# Patient Record
Sex: Male | Born: 1980 | Race: White | Hispanic: No | Marital: Married | Smoking: Former smoker
Health system: Southern US, Community
[De-identification: ages and names within clinical notes are randomized; demographics above are authoritative.]

## PROBLEM LIST (undated history)

## (undated) DIAGNOSIS — N289 Disorder of kidney and ureter, unspecified: Secondary | ICD-10-CM

## (undated) DIAGNOSIS — I10 Essential (primary) hypertension: Secondary | ICD-10-CM

## (undated) DIAGNOSIS — G4730 Sleep apnea, unspecified: Secondary | ICD-10-CM

## (undated) DIAGNOSIS — E119 Type 2 diabetes mellitus without complications: Secondary | ICD-10-CM

## (undated) DIAGNOSIS — Z87442 Personal history of urinary calculi: Secondary | ICD-10-CM

## (undated) HISTORY — PX: COLONOSCOPY: SHX174

## (undated) HISTORY — PX: LITHOTRIPSY: SUR834

---

## 2007-07-02 ENCOUNTER — Emergency Department: Payer: Self-pay | Admitting: Emergency Medicine

## 2009-02-14 ENCOUNTER — Emergency Department: Payer: Self-pay | Admitting: Emergency Medicine

## 2011-08-15 ENCOUNTER — Emergency Department: Payer: Self-pay | Admitting: Emergency Medicine

## 2013-03-19 ENCOUNTER — Emergency Department: Payer: Self-pay | Admitting: Emergency Medicine

## 2013-06-08 DIAGNOSIS — M2140 Flat foot [pes planus] (acquired), unspecified foot: Secondary | ICD-10-CM | POA: Insufficient documentation

## 2013-06-08 DIAGNOSIS — M722 Plantar fascial fibromatosis: Secondary | ICD-10-CM | POA: Insufficient documentation

## 2013-12-17 ENCOUNTER — Ambulatory Visit: Payer: Self-pay | Admitting: Urology

## 2013-12-25 DIAGNOSIS — G4730 Sleep apnea, unspecified: Secondary | ICD-10-CM | POA: Insufficient documentation

## 2013-12-25 DIAGNOSIS — N200 Calculus of kidney: Secondary | ICD-10-CM | POA: Insufficient documentation

## 2013-12-25 DIAGNOSIS — I10 Essential (primary) hypertension: Secondary | ICD-10-CM | POA: Insufficient documentation

## 2016-02-15 ENCOUNTER — Ambulatory Visit
Admission: EM | Admit: 2016-02-15 | Discharge: 2016-02-15 | Disposition: A | Discharge status: Home / Self Care | Payer: 59 | Attending: Emergency Medicine | Admitting: Emergency Medicine

## 2016-02-15 ENCOUNTER — Encounter: Payer: Self-pay | Admitting: *Deleted

## 2016-02-15 DIAGNOSIS — R109 Unspecified abdominal pain: Secondary | ICD-10-CM

## 2016-02-15 DIAGNOSIS — R3129 Other microscopic hematuria: Secondary | ICD-10-CM

## 2016-02-15 HISTORY — DX: Disorder of kidney and ureter, unspecified: N28.9

## 2016-02-15 HISTORY — DX: Essential (primary) hypertension: I10

## 2016-02-15 HISTORY — DX: Sleep apnea, unspecified: G47.30

## 2016-02-15 LAB — URINALYSIS, COMPLETE (UACMP) WITH MICROSCOPIC
BACTERIA UA: NONE SEEN
BILIRUBIN URINE: NEGATIVE
Glucose, UA: NEGATIVE mg/dL
KETONES UR: NEGATIVE mg/dL
Leukocytes, UA: NEGATIVE
Nitrite: NEGATIVE
Protein, ur: NEGATIVE mg/dL
SPECIFIC GRAVITY, URINE: 1.025 (ref 1.005–1.030)
SQUAMOUS EPITHELIAL / LPF: NONE SEEN
pH: 5.5 (ref 5.0–8.0)

## 2016-02-15 MED ORDER — HYDROCODONE-ACETAMINOPHEN 5-325 MG PO TABS: 1.0000 | ORAL_TABLET | Freq: Four times a day (QID) | ORAL | 0 refills | Status: DC | PRN

## 2016-02-15 MED ORDER — CYCLOBENZAPRINE HCL 10 MG PO TABS: 10.0000 mg | ORAL_TABLET | Freq: Three times a day (TID) | ORAL | 0 refills | Status: DC | PRN

## 2016-02-15 NOTE — ED Triage Notes (Signed)
Patient started having unexplained bilateral flank pain 2 weeks ago. Pain comes and goes quickly but is becoming more intense. Patient does have a history of kidney stones.

## 2016-02-15 NOTE — ED Provider Notes (Signed)
CSN: [id number]     Arrival date & time 02/15/16  1220 History   First MD Initiated Contact with Patient 02/15/16 1350     Chief Complaint  Patient presents with  . Flank Pain   (Consider location/radiation/quality/duration/timing/severity/associated sxs/prior Treatment) 35 year old male presents with left sided flank pain that has been occurring for the past 2 weeks. He experienced sharp left sided upper thoracic/flank pain that is more like a "cramp" but has been occurring with increased frequency and intensity over the past 2 weeks. Has been trying to turn and "stretch" muscles in back with minimal relief. Has not taken any medication for symptoms. Is allergic to Aspirin and Ibuprofen (rash). Denies any dysuria, hematuria, abdominal pain, penile pain/discharge or any scrotal pain. Has history of renal stones in the past that passed without intervention. Also has history of muscular low back strain 3 years ago but this pain is more intense and more similar to episode of renal stones.    The history is provided by the patient.    Past Medical History:  Diagnosis Date  . Hypertension   . Renal disorder   . Sleep apnea    History reviewed. No pertinent surgical history. History reviewed. No pertinent family history. Social History  Substance Use Topics  . Smoking status: Former Games developermoker  . Smokeless tobacco: Never Used  . Alcohol use Yes    Review of Systems  Constitutional: Negative for appetite change, chills, fatigue and fever.  Respiratory: Negative for cough, chest tightness, shortness of breath and wheezing.   Cardiovascular: Negative for chest pain.  Gastrointestinal: Negative for abdominal pain, blood in stool, constipation, diarrhea, nausea and vomiting.  Genitourinary: Positive for flank pain. Negative for decreased urine volume, difficulty urinating, discharge, dysuria, hematuria, penile pain, penile swelling, scrotal swelling, testicular pain and urgency.  Musculoskeletal:  Positive for back pain. Negative for neck pain and neck stiffness.  Skin: Negative for rash.  Neurological: Negative for dizziness, weakness, numbness and headaches.  Hematological: Negative for adenopathy.    Allergies  Aspirin and Ibuprofen  Home Medications   Prior to Admission medications   Medication Sig Start Date End Date Taking? Authorizing Provider  triamterene-hydrochlorothiazide (DYAZIDE) 37.5-25 MG capsule Take 1 capsule by mouth daily.   Yes Historical Provider, MD  cyclobenzaprine (FLEXERIL) 10 MG tablet Take 1 tablet (10 mg total) by mouth 3 (three) times daily as needed for muscle spasms. 02/15/16   Sudie GrumblingAnn Berry Amyot, NP  HYDROcodone-acetaminophen (NORCO/VICODIN) 5-325 MG tablet Take 1-2 tablets by mouth every 6 (six) hours as needed. 02/15/16   Sudie GrumblingAnn Berry Amyot, NP   Meds Ordered and Administered this Visit  Medications - No data to display  BP (!) 159/89 (BP Location: Left Arm)   Pulse 91   Temp 97.8 F (36.6 C) (Oral)   Resp 16   Ht 5\' 11"  (1.803 m)   Wt (!) 350 lb (158.8 kg)   SpO2 100%   BMI 48.82 kg/m  No data found.   Physical Exam  Constitutional: He is oriented to person, place, and time. He appears well-developed and well-nourished. No distress.  Cardiovascular: Normal rate, regular rhythm and normal heart sounds.   Pulmonary/Chest: Effort normal and breath sounds normal. No respiratory distress. He has no decreased breath sounds. He has no wheezes.  Abdominal: Soft. Bowel sounds are normal. He exhibits no distension and no mass. There is no hepatosplenomegaly. There is no tenderness. There is CVA tenderness (Left). There is no rigidity, no rebound and no  guarding.  Musculoskeletal: He exhibits tenderness.       Lumbar back: He exhibits tenderness, pain and spasm. He exhibits no swelling, no edema and no deformity.       Back:  Tender along left lower thoracic region/flank area with some tenderness along left upper abdomen. Has full range of motion of  back. Pain is constant and does not change with movement.    Neurological: He is alert and oriented to person, place, and time. He has normal strength and normal reflexes. No sensory deficit.  Skin: Skin is warm and dry. Capillary refill takes less than 2 seconds.  Psychiatric: He has a normal mood and affect. His behavior is normal. Judgment and thought content normal.    Urgent Care Course   Clinical Course     Procedures (including critical care time)  Labs Review Labs Reviewed  URINALYSIS, COMPLETE (UACMP) WITH MICROSCOPIC - Abnormal; Notable for the following:       Result Value   APPearance HAZY (*)    Hgb urine dipstick MODERATE (*)    All other components within normal limits    Imaging Review No results found.   Visual Acuity Review  Right Eye Distance:   Left Eye Distance:   Bilateral Distance:    Right Eye Near:   Left Eye Near:    Bilateral Near:         MDM   1. Left flank pain   2. Other microscopic hematuria    Discussed with patient that he may have a renal stone. His exam findings also suggest a musculoskeletal component as well. Reviewed urinalysis results- positive blood- with patient. May try Flexeril 10mg  3 times a day as needed for muscle spasms. Recommend Vicodin 5/325mg  #12 no refill 1-2 tablets every 6 hours as needed for pain. [place] Controlled Substance Registry reviewed- no active RX for patient in past 90 days. Patient has made an appointment with his PCP tomorrow for recheck. Recommend strain urine today.  Follow-up with his PCP tomorrow as planned or go to ER if symptoms worsen.     Sudie GrumblingAnn Berry Amyot, NP 02/15/16 [phone]

## 2016-02-15 NOTE — Discharge Instructions (Signed)
Your urinalysis showed moderate amount of blood in your urine but no signs of infection. Recommend use Flexeril 10mg  every 8 hours as needed for muscle spasms. May take Vicodin 1-2 tablets every 6 hours as needed for severe pain. Follow-up with your primary care provider tomorrow for recheck. May need referral to Urologist and/or additional testing.

## 2016-12-10 ENCOUNTER — Telehealth: Payer: Self-pay | Admitting: *Deleted

## 2016-12-10 ENCOUNTER — Encounter: Payer: Self-pay | Admitting: *Deleted

## 2016-12-10 ENCOUNTER — Ambulatory Visit
Admission: EM | Admit: 2016-12-10 | Discharge: 2016-12-10 | Disposition: A | Discharge status: Home / Self Care | Payer: No Typology Code available for payment source | Attending: Family Medicine | Admitting: Family Medicine

## 2016-12-10 DIAGNOSIS — M109 Gout, unspecified: Secondary | ICD-10-CM

## 2016-12-10 DIAGNOSIS — M79674 Pain in right toe(s): Secondary | ICD-10-CM | POA: Diagnosis not present

## 2016-12-10 MED ORDER — PREDNISONE 20 MG PO TABS: ORAL_TABLET | ORAL | 0 refills | Status: DC

## 2016-12-10 MED ORDER — HYDROCODONE-ACETAMINOPHEN 5-325 MG PO TABS
ORAL_TABLET | ORAL | 0 refills | Status: DC
Start: 2016-12-10 — End: 2019-11-30

## 2016-12-10 NOTE — ED Provider Notes (Signed)
[facility]    CSN: [id number] Arrival date & time: 12/10/16  1253     History   Chief Complaint Chief Complaint  Patient presents with  . Foot Pain    HPI Frank Decker is a 36 y.o. male.   36 yo male with a c/o right big toe pain for 2 days. States pain started suddenly without any history of trauma/injury. Denies any fevers, chills, rash.    The history is provided by the patient.  Foot Pain     Past Medical History:  Diagnosis Date  . Hypertension   . Renal disorder   . Sleep apnea     There are no active problems to display for this patient.   History reviewed. No pertinent surgical history.     Home Medications    Prior to Admission medications   Medication Sig Start Date End Date Taking? Authorizing Provider  triamterene-hydrochlorothiazide (DYAZIDE) 37.5-25 MG capsule Take 1 capsule by mouth daily.   Yes [provider]  cyclobenzaprine (FLEXERIL) 10 MG tablet Take 1 tablet (10 mg total) by mouth 3 (three) times daily as needed for muscle spasms. 02/15/16   Sudie Grumbling, NP  HYDROcodone-acetaminophen (NORCO/VICODIN) 5-325 MG tablet 1-2 tabs po bid prn 12/10/16   Payton Mccallum, MD  predniSONE (DELTASONE) 20 MG tablet 3 tabs po qd for 2 days, then 2 tabs po qd for 3 days, then 1 tab po qd for 3 days, then half a tab po qd for 2 days 12/10/16   Payton Mccallum, MD    Family History History reviewed. No pertinent family history.  Social History Social History  Substance Use Topics  . Smoking status: Former Games developer  . Smokeless tobacco: Never Used  . Alcohol use Yes     Allergies   Aspirin and Ibuprofen   Review of Systems Review of Systems   Physical Exam Triage Vital Signs ED Triage Vitals [12/10/16 1327]  Enc Vitals Group     BP (!) 150/97     Pulse Rate 97     Resp 16     Temp 97.8 F (36.6 C)     Temp Source Oral     SpO2 99 %     Weight (!) 350 lb (158.8 kg)     Height  (1.803 m)     Head  Circumference      Peak Flow      Pain Score 8     Pain Loc      Pain Edu?      Excl. in GC?    No data found.   Updated Vital Signs BP (!) 150/97 (BP Location: Left Arm)   Pulse 97   Temp 97.8 F (36.6 C) (Oral)   Resp 16   Ht  (1.803 m)   Wt (!) 350 lb (158.8 kg)   SpO2 99%   BMI 48.82 kg/m   Visual Acuity Right Eye Distance:   Left Eye Distance:   Bilateral Distance:    Right Eye Near:   Left Eye Near:    Bilateral Near:     Physical Exam  Constitutional: He appears well-developed and well-nourished. No distress.  Musculoskeletal:       Right foot: There is bony tenderness (over the 1st MTP joint) and swelling. There is normal range of motion, normal capillary refill, no crepitus, no deformity and no laceration.  Skin: He is not diaphoretic.  Nursing note and vitals reviewed.    UC Treatments /  Results  Labs (all labs ordered are listed, but only abnormal results are displayed) Labs Reviewed - No data to display  EKG  EKG Interpretation None       Radiology No results found.  Procedures Procedures (including critical care time)  Medications Ordered in UC Medications - No data to display   Initial Impression / Assessment and Plan / UC Course  I have reviewed the triage vital signs and the nursing notes.  Pertinent labs & imaging results that were available during my care of the patient were reviewed by me and considered in my medical decision making (see chart for details).       Final Clinical Impressions(s) / UC Diagnoses   Final diagnoses:  Gout of big toe    New Prescriptions Discharge Medication List as of 12/10/2016  2:32 PM    START taking these medications   Details  predniSONE (DELTASONE) 20 MG tablet 3 tabs po qd for 2 days, then 2 tabs po qd for 3 days, then 1 tab po qd for 3 days, then half a tab po qd for 2 days, Normal       1. diagnosis reviewed with patient 2. rx as per orders above; reviewed possible side  effects, interactions, risks and benefits  3. Recommend supportive treatment with rest, elevation  4. Follow-up prn if symptoms worsen or don't improve  Controlled Substance Prescriptions [place] Controlled Substance Registry consulted? No   Payton Mccallum, MD 12/10/16 [phone]

## 2016-12-10 NOTE — ED Triage Notes (Signed)
PAtient started having right foot discomfort 2 days ago that has become right foot pain this AM. Patient reports participating in a 5K walk 2 days ago.

## 2017-04-17 DIAGNOSIS — R7303 Prediabetes: Secondary | ICD-10-CM | POA: Insufficient documentation

## 2017-05-12 ENCOUNTER — Encounter: Payer: Self-pay | Admitting: Emergency Medicine

## 2017-05-12 ENCOUNTER — Emergency Department
Admission: EM | Admit: 2017-05-12 | Discharge: 2017-05-12 | Disposition: A | Discharge status: Home / Self Care | Payer: No Typology Code available for payment source | Attending: Emergency Medicine | Admitting: Emergency Medicine

## 2017-05-12 ENCOUNTER — Emergency Department: Payer: No Typology Code available for payment source

## 2017-05-12 DIAGNOSIS — Z87891 Personal history of nicotine dependence: Secondary | ICD-10-CM | POA: Insufficient documentation

## 2017-05-12 DIAGNOSIS — Z79899 Other long term (current) drug therapy: Secondary | ICD-10-CM | POA: Insufficient documentation

## 2017-05-12 DIAGNOSIS — R079 Chest pain, unspecified: Secondary | ICD-10-CM

## 2017-05-12 DIAGNOSIS — R0789 Other chest pain: Secondary | ICD-10-CM | POA: Insufficient documentation

## 2017-05-12 DIAGNOSIS — I10 Essential (primary) hypertension: Secondary | ICD-10-CM | POA: Insufficient documentation

## 2017-05-12 LAB — BASIC METABOLIC PANEL
Anion gap: 11 (ref 5–15)
BUN: 13 mg/dL (ref 6–20)
CHLORIDE: 108 mmol/L (ref 101–111)
CO2: 22 mmol/L (ref 22–32)
Calcium: 9.3 mg/dL (ref 8.9–10.3)
Creatinine, Ser: 0.96 mg/dL (ref 0.61–1.24)
GFR calc Af Amer: >60 mL/min (ref >60)
GFR calc non Af Amer: >60 mL/min (ref >60)
GLUCOSE: 102 mg/dL — AB (ref 65–99)
POTASSIUM: 3.6 mmol/L (ref 3.5–5.1)
Sodium: 141 mmol/L (ref 135–145)

## 2017-05-12 LAB — CBC
HEMATOCRIT: 42.7 % (ref 40.0–52.0)
HEMOGLOBIN: 14.6 g/dL (ref 13.0–18.0)
MCH: 30.6 pg (ref 26.0–34.0)
MCHC: 34.1 g/dL (ref 32.0–36.0)
MCV: 89.8 fL (ref 80.0–100.0)
Platelets: 319 10*3/uL (ref 150–440)
RBC: 4.76 MIL/uL (ref 4.40–5.90)
RDW: 13.1 % (ref 11.5–14.5)
WBC: 10.4 10*3/uL (ref 3.8–10.6)

## 2017-05-12 LAB — TROPONIN I: Troponin I: <0.03 ng/mL (ref <0.03)

## 2017-05-12 MED ORDER — GI COCKTAIL ~~LOC~~
ORAL | Status: AC
  Filled 2017-05-12: qty 30

## 2017-05-12 MED ORDER — GI COCKTAIL ~~LOC~~
30.0000 mL | Freq: Once | ORAL | Status: AC
  Administered 2017-05-12: 30 mL via ORAL

## 2017-05-12 NOTE — ED Provider Notes (Signed)
[facility] Emergency Department Provider Note _________________________________   I have reviewed the triage vital signs and the nursing notes.   HISTORY  Chief Complaint Chest Pain   History limited by: Not Limited   HPI Frank Decker is a 37 y.o. male who presents to the emergency department today because of concerns for chest pain.  Is located in the center chest.  He describes as pressure.  It has been off and on for a couple of weeks.  Did go to an outside emergency department for where he states he was given some Ativan which helped.  Unclear exactly what labs and tests were performed at that time although it was recommended he follow-up and get a stress test.  The patient has not noticed any pattern to the pain.  He states it will happen during exertion and at rest.  Is not positional.  It does not change depending on whether he has recently eaten or not.   Per medical record review patient has a history of HTN  Past Medical History:  Diagnosis Date  . Hypertension   . Renal disorder   . Sleep apnea     There are no active problems to display for this patient.   History reviewed. No pertinent surgical history.  Prior to Admission medications   Medication Sig Start Date End Date Taking? Authorizing Provider  cyclobenzaprine (FLEXERIL) 10 MG tablet Take 1 tablet (10 mg total) by mouth 3 (three) times daily as needed for muscle spasms. 02/15/16   Sudie Grumbling, NP  HYDROcodone-acetaminophen (NORCO/VICODIN) 5-325 MG tablet 1-2 tabs po bid prn 12/10/16   Payton Mccallum, MD  predniSONE (DELTASONE) 20 MG tablet 3 tabs po qd for 2 days, then 2 tabs po qd for 3 days, then 1 tab po qd for 3 days, then half a tab po qd for 2 days 12/10/16   Payton Mccallum, MD  triamterene-hydrochlorothiazide (DYAZIDE) 37.5-25 MG capsule Take 1 capsule by mouth daily.    [provider]    Allergies Aspirin and Ibuprofen  No family history on file.  Social  History Social History   Tobacco Use  . Smoking status: Former Games developer  . Smokeless tobacco: Never Used  Substance Use Topics  . Alcohol use: Yes  . Drug use: No    Review of Systems Constitutional: No fever/chills Eyes: No visual changes. ENT: No sore throat. Cardiovascular: Denies chest pain. Respiratory: Positive for shortness of breath. Gastrointestinal: No abdominal pain.  No nausea, no vomiting.  No diarrhea.   Genitourinary: Negative for dysuria. Musculoskeletal: Negative for back pain. Skin: Negative for rash. Neurological: Negative for headaches, focal weakness or numbness.  ____________________________________________   PHYSICAL EXAM:  VITAL SIGNS: ED Triage Vitals  Enc Vitals Group     BP 05/12/17 1553 (!) 164/88     Pulse Rate 05/12/17 1553 85     Resp --      Temp 05/12/17 1553 98.2 F (36.8 C)     Temp Source 05/12/17 1553 Oral     SpO2 05/12/17 1553 99 %     Weight 05/12/17 1551 (!) 338 lb (153.3 kg)     Height 05/12/17 1551 5\' 11"  (1.803 m)     Head Circumference --      Peak Flow --      Pain Score 05/12/17 1551 3   Constitutional: Alert and oriented. Well appearing and in no distress. Eyes: Conjunctivae are normal.  ENT   Head: Normocephalic and atraumatic.  Nose: No congestion/rhinnorhea.   Mouth/Throat: Mucous membranes are moist.   Neck: No stridor. Hematological/Lymphatic/Immunilogical: No cervical lymphadenopathy. Cardiovascular: Normal rate, regular rhythm.  No murmurs, rubs, or gallops.  Respiratory: Normal respiratory effort without tachypnea nor retractions. Breath sounds are clear and equal bilaterally. No wheezes/rales/rhonchi. Gastrointestinal: Soft and non tender. No rebound. No guarding.  Genitourinary: Deferred Musculoskeletal: Normal range of motion in all extremities. No lower extremity edema. Neurologic:  Normal speech and language. No gross focal neurologic deficits are appreciated.  Skin:  Skin is warm, dry  and intact. No rash noted. Psychiatric: Mood and affect are normal. Speech and behavior are normal. Patient exhibits appropriate insight and judgment.  ____________________________________________    LABS (pertinent positives/negatives)  Trop <0.03 CBC wnl BMP glu 102 otherwise wnl ____________________________________________   EKG  I, Phineas SemenGraydon Lovelle Lema, attending physician, personally viewed and interpreted this EKG  EKG Time: 1552 Rate: 86 Rhythm: normal sinus rhythm Axis: normal Intervals: qtc 433 QRS: narrow ST changes: no st elevation Impression: normal ekg   ____________________________________________    RADIOLOGY  CXR No acute disease  ____________________________________________   PROCEDURES  Procedures  ____________________________________________   INITIAL IMPRESSION / ASSESSMENT AND PLAN / ED COURSE  Pertinent labs & imaging results that were available during my care of the patient were reviewed by me and considered in my medical decision making (see chart for details).  Patient presented to the emergency department today because of concerns for chest pain.  The patient had apparently been seen at an outside ED for this problem roughly 2 weeks ago and felt better after ativan. Work up here without any concerning findings for ACS, pneumonia, PTX. Doubt PE at this time. Doubt dissection. Did try GI cocktail which did not give any significant relief. At this point think anxiety possible. Discussed this with patient, will give [facility] follow up information. Also discussed return precautions.    ____________________________________________   FINAL CLINICAL IMPRESSION(S) / ED DIAGNOSES  Final diagnoses:  Nonspecific chest pain     Note: This dictation was prepared with Dragon dictation. Any transcriptional errors that result from this process are unintentional     Phineas SemenGoodman, Necole Minassian, MD 05/12/17 2016

## 2017-05-12 NOTE — Discharge Instructions (Signed)
Please seek medical attention for any high fevers, chest pain, shortness of breath, change in behavior, persistent vomiting, bloody stool or any other new or concerning symptoms.  

## 2017-05-12 NOTE — ED Notes (Signed)
Pt reports that he is having some chest pressure that it is intermittent for a week.  Pt states that sometimes it happens after he eats, but sometimes unrelated.  Pt denies SOB and dizziness.  Pt reports that when he takes a deep breath he can reproduce the pain.

## 2017-05-12 NOTE — ED Triage Notes (Signed)
Pt comes into the ED iv aPOV c/o central chest tightness that started today.  Denies any radiating pain, shortness of breath, or nausea.  Patient in NAD at this time with even and unlabored respiration.  Denies any cardiac history or recent coughs.

## 2019-06-03 ENCOUNTER — Emergency Department: Payer: BC Managed Care – PPO

## 2019-06-03 ENCOUNTER — Encounter: Payer: Self-pay | Admitting: Emergency Medicine

## 2019-06-03 ENCOUNTER — Other Ambulatory Visit: Payer: Self-pay

## 2019-06-03 ENCOUNTER — Emergency Department
Admission: EM | Admit: 2019-06-03 | Discharge: 2019-06-03 | Disposition: A | Discharge status: Home / Self Care | Payer: BC Managed Care – PPO | Attending: Emergency Medicine | Admitting: Emergency Medicine

## 2019-06-03 DIAGNOSIS — M79662 Pain in left lower leg: Secondary | ICD-10-CM | POA: Diagnosis present

## 2019-06-03 DIAGNOSIS — I82402 Acute embolism and thrombosis of unspecified deep veins of left lower extremity: Secondary | ICD-10-CM | POA: Insufficient documentation

## 2019-06-03 DIAGNOSIS — Z87891 Personal history of nicotine dependence: Secondary | ICD-10-CM | POA: Diagnosis not present

## 2019-06-03 DIAGNOSIS — I10 Essential (primary) hypertension: Secondary | ICD-10-CM | POA: Insufficient documentation

## 2019-06-03 NOTE — ED Provider Notes (Signed)
[facility] Emergency Department Provider Note       Time seen: ----------------------------------------- 12:36 PM on 06/03/2019 -----------------------------------------   I have reviewed the triage vital signs and the nursing notes.  HISTORY   Chief Complaint Leg Pain   HPI Frank Decker is a 39 y.o. male with a history of hypertension, renal disorder, sleep apnea who presents to the ED for left calf pain that was sent by his doctor because he had elevated D-dimer.  Patient was walking with a slight limp, arrives in no acute distress.  Past Medical History:  Diagnosis Date  . Hypertension   . Renal disorder   . Sleep apnea     There are no problems to display for this patient.   History reviewed. No pertinent surgical history.  Allergies Aspirin and Ibuprofen  Social History Social History   Tobacco Use  . Smoking status: Former Games developer  . Smokeless tobacco: Never Used  Substance Use Topics  . Alcohol use: Yes  . Drug use: No    Review of Systems Constitutional: Negative for fever. Cardiovascular: Negative for chest pain. Respiratory: Negative for shortness of breath. Gastrointestinal: Negative for abdominal pain, vomiting and diarrhea. Musculoskeletal: Positive for left leg pain Skin: Negative for rash. Neurological: Negative for headaches, focal weakness or numbness.  All systems negative/normal/unremarkable except as stated in the HPI  ____________________________________________   PHYSICAL EXAM:  VITAL SIGNS: ED Triage Vitals [06/03/19 1106]  Enc Vitals Group     BP 131/74     Pulse Rate 91     Resp 20     Temp 98.5 F (36.9 C)     Temp Source Oral     SpO2 98 %     Weight (!) 350 lb (158.8 kg)     Height 5\' 11"  (1.803 m)     Head Circumference      Peak Flow      Pain Score      Pain Loc      Pain Edu?      Excl. in GC?     Constitutional: Alert and oriented. Well appearing and in no distress. Eyes:  Conjunctivae are normal. Normal extraocular movements. Cardiovascular: Normal rate, regular rhythm. No murmurs, rubs, or gallops. Respiratory: Normal respiratory effort without tachypnea nor retractions. Breath sounds are clear and equal bilaterally. No wheezes/rales/rhonchi. Gastrointestinal: Soft and nontender. Normal bowel sounds Musculoskeletal: Nontender with normal range of motion in extremities. No lower extremity tenderness nor edema. Neurologic:  Normal speech and language. No gross focal neurologic deficits are appreciated.  Skin:  Skin is warm, dry and intact. No rash noted. Psychiatric: Mood and affect are normal. Speech and behavior are normal.  ____________________________________________  ED COURSE:  As part of my medical decision making, I reviewed the following data within the electronic MEDICAL RECORD NUMBER History obtained from family if available, nursing notes, old chart and ekg, as well as notes from prior ED visits. Patient presented for left leg pain with outpatient elevated D-dimer, we will assess with labs and imaging as indicated at this time.   Procedures  Haitham Dolinsky was evaluated in Emergency Department on 06/03/2019 for the symptoms described in the history of present illness. He was evaluated in the context of the global COVID-19 pandemic, which necessitated consideration that the patient might be at risk for infection with the SARS-CoV-2 virus that causes COVID-19. Institutional protocols and algorithms that pertain to the evaluation of patients at risk for COVID-19 are in a state of  rapid change based on information released by regulatory bodies including the CDC and federal and state organizations. These policies and algorithms were followed during the patient's care in the ED.  ____________________________________________   RADIOLOGY Images were viewed by me  Lower extremity ultrasound IMPRESSION:  Positive for deep venous thrombosis in the left lower  extremity.  Thrombus involving a left peroneal vein. No evidence for deep venous  thrombosis above the left knee.   Small fluid collection along the anterolateral left knee at the area  of pain.  ____________________________________________   DIFFERENTIAL DIAGNOSIS   DVT, muscle strain, Baker's cyst, cellulitis  FINAL ASSESSMENT AND PLAN  Left leg pain, DVT   Plan: The patient had presented for left leg pain. Patient's imaging did reveal an isolated DVT below the knee in the left peroneal vein.  Upon discussion with vascular surgery this can be treated with follow-up ultrasound in 7 to 14 days.  He is allergic aspirin, he is encouraged to return to light duty and return for worsening or worrisome symptoms.  He has no chest pain or difficulty breathing.   Laurence Aly, MD    Note: This note was generated in part or whole with voice recognition software. Voice recognition is usually quite accurate but there are transcription errors that can and very often do occur. I apologize for any typographical errors that were not detected and corrected.     Earleen Newport, MD 06/03/19 1341

## 2019-06-03 NOTE — ED Notes (Addendum)
Patient reports sent to ed for left lower leg u/s r/o DVT. Patient denies sob primary complaint of pain to left calf radiates into left knee.  Awaiting u/s. Will continue to monitor.

## 2019-06-03 NOTE — ED Triage Notes (Signed)
Patient presents to the ED with left calf pain and was sent to the ED by his PCP because he had an elevated d-dimer.  Patient walking with a slight limp.  Patient is in no obvious distress at this time.

## 2019-06-03 NOTE — ED Notes (Signed)
Patient off unit to u/s  

## 2019-06-09 ENCOUNTER — Other Ambulatory Visit: Payer: Self-pay | Admitting: Medical Oncology

## 2019-06-09 ENCOUNTER — Emergency Department
Admission: EM | Admit: 2019-06-09 | Discharge: 2019-06-09 | Disposition: A | Discharge status: Home / Self Care | Payer: BC Managed Care – PPO | Attending: Emergency Medicine | Admitting: Emergency Medicine

## 2019-06-09 ENCOUNTER — Other Ambulatory Visit: Payer: Self-pay

## 2019-06-09 ENCOUNTER — Ambulatory Visit
Admission: RE | Admit: 2019-06-09 | Discharge: 2019-06-09 | Disposition: A | Discharge status: Home / Self Care | Payer: BC Managed Care – PPO | Source: Ambulatory Visit | Attending: Medical Oncology | Admitting: Medical Oncology

## 2019-06-09 ENCOUNTER — Encounter: Payer: Self-pay | Admitting: *Deleted

## 2019-06-09 DIAGNOSIS — I824Y2 Acute embolism and thrombosis of unspecified deep veins of left proximal lower extremity: Secondary | ICD-10-CM | POA: Diagnosis not present

## 2019-06-09 DIAGNOSIS — Z87891 Personal history of nicotine dependence: Secondary | ICD-10-CM | POA: Insufficient documentation

## 2019-06-09 DIAGNOSIS — I10 Essential (primary) hypertension: Secondary | ICD-10-CM | POA: Diagnosis not present

## 2019-06-09 DIAGNOSIS — Z79899 Other long term (current) drug therapy: Secondary | ICD-10-CM | POA: Insufficient documentation

## 2019-06-09 DIAGNOSIS — M79605 Pain in left leg: Secondary | ICD-10-CM | POA: Insufficient documentation

## 2019-06-09 MED ORDER — APIXABAN 5 MG PO TABS
10.0000 mg | ORAL_TABLET | Freq: Once | ORAL | Status: AC
  Administered 2019-06-09: 20:00:00 10 mg via ORAL
  Filled 2019-06-09: qty 2

## 2019-06-09 MED ORDER — APIXABAN (ELIQUIS) VTE STARTER PACK (10MG AND 5MG): ORAL_TABLET | ORAL | 0 refills | Status: DC

## 2019-06-09 NOTE — Discharge Instructions (Signed)
Please seek medical attention for any high fevers, chest pain, shortness of breath, change in behavior, persistent vomiting, bloody stool or any other new or concerning symptoms.  

## 2019-06-09 NOTE — ED Triage Notes (Signed)
Pt recently dx with DVT last week and was sent home without prescription blood thinners. Pt had a follow up US that pt reports showed a worsening of DVT. PT to ED due to increased pain in left calf. Pt limping to triage.

## 2019-06-09 NOTE — ED Provider Notes (Signed)
[facility] Emergency Department Provider Note   ____________________________________________   I have reviewed the triage vital signs and the nursing notes.   HISTORY  Chief Complaint DVT   History limited by: Not Limited   HPI Frank Decker is a 39 y.o. male who presents to the emergency department today because of continued left leg pain and an ultrasound which shows deep vein thrombosis.  Patient was seen in the emergency department last week and diagnosed with DVT.  At that time the plan was for repeat ultrasound.  Repeat ultrasound today continues to show the deep vein thrombosis.  Given continued and slightly worsening pain patient came to the emergency department. Pain is worse with ambulation. Patient denies any chest pain or shortness of breath.    Records reviewed. Per medical record review patient has a history of htn. Recent ed visit for DVT.   Past Medical History:  Diagnosis Date  . Hypertension   . Renal disorder   . Sleep apnea     There are no problems to display for this patient.   History reviewed. No pertinent surgical history.  Prior to Admission medications   Medication Sig Start Date End Date Taking? Authorizing Provider  cyclobenzaprine (FLEXERIL) 10 MG tablet Take 1 tablet (10 mg total) by mouth 3 (three) times daily as needed for muscle spasms. 02/15/16   Katy Apo, NP  HYDROcodone-acetaminophen (NORCO/VICODIN) 5-325 MG tablet 1-2 tabs po bid prn 12/10/16   Norval Gable, MD  predniSONE (DELTASONE) 20 MG tablet 3 tabs po qd for 2 days, then 2 tabs po qd for 3 days, then 1 tab po qd for 3 days, then half a tab po qd for 2 days 12/10/16   Norval Gable, MD  triamterene-hydrochlorothiazide (DYAZIDE) 37.5-25 MG capsule Take 1 capsule by mouth daily.    [provider]    Allergies Aspirin and Ibuprofen  History reviewed. No pertinent family history.  Social History Social History   Tobacco Use  . Smoking  status: Former Research scientist (life sciences)  . Smokeless tobacco: Never Used  Substance Use Topics  . Alcohol use: Yes  . Drug use: No    Review of Systems Constitutional: No fever/chills Eyes: No visual changes. ENT: No sore throat. Cardiovascular: Denies chest pain. Respiratory: Denies shortness of breath. Gastrointestinal: No abdominal pain.  No nausea, no vomiting.  No diarrhea.   Genitourinary: Negative for dysuria. Musculoskeletal: Positive for left leg pain. Skin: Negative for rash. Neurological: Negative for headaches, focal weakness or numbness.  ____________________________________________   PHYSICAL EXAM:  VITAL SIGNS: ED Triage Vitals  Enc Vitals Group     BP 06/09/19 1933 (!) 151/92     Pulse Rate 06/09/19 1933 91     Resp 06/09/19 1930 16     Temp 06/09/19 1933 98.4 F (36.9 C)     Temp Source 06/09/19 1930 Oral     SpO2 --      Weight 06/09/19 1925 (!) 350 lb (158.8 kg)     Height 06/09/19 1925 5\' 11"  (1.803 m)     Head Circumference --      Peak Flow --      Pain Score 06/09/19 1923 3   Constitutional: Alert and oriented.  Eyes: Conjunctivae are normal.  ENT      Head: Normocephalic and atraumatic.      Nose: No congestion/rhinnorhea.      Mouth/Throat: Mucous membranes are moist.      Neck: No stridor. Hematological/Lymphatic/Immunilogical: No cervical lymphadenopathy. Cardiovascular:  Normal rate, regular rhythm.  No murmurs, rubs, or gallops.  Respiratory: Normal respiratory effort without tachypnea nor retractions. Breath sounds are clear and equal bilaterally. No wheezes/rales/rhonchi. Gastrointestinal: Soft and non tender. No rebound. No guarding.  Genitourinary: Deferred Musculoskeletal: Normal range of motion in all extremities. Slight swelling noted to the left leg.  Neurologic:  Normal speech and language. No gross focal neurologic deficits are appreciated.  Skin:  Skin is warm, dry and intact. No rash noted. Psychiatric: Mood and affect are normal. Speech  and behavior are normal. Patient exhibits appropriate insight and judgment.  ____________________________________________    LABS (pertinent positives/negatives)  None  ____________________________________________   EKG  None  ____________________________________________    RADIOLOGY  None  ____________________________________________   PROCEDURES  Procedures  ____________________________________________   INITIAL IMPRESSION / ASSESSMENT AND PLAN / ED COURSE  Pertinent labs & imaging results that were available during my care of the patient were reviewed by me and considered in my medical decision making (see chart for details).   Patient presented to the emergency department today because of concerns for worsening left leg pain and ultrasound which did not show deep vein thrombosis.  Will plan on putting patient on blood thinning medication at this time.  Did discuss pain control and at this time patient did not want any prescription pain medications. No symptoms concerning for PE.  Will give patient vascular surgery follow-up.  ____________________________________________   FINAL CLINICAL IMPRESSION(S) / ED DIAGNOSES  Final diagnoses:  Acute deep vein thrombosis (DVT) of proximal vein of left lower extremity (HCC)     Note: This dictation was prepared with Dragon dictation. Any transcriptional errors that result from this process are unintentional     Phineas Semen, MD 06/09/19 2153

## 2019-06-10 ENCOUNTER — Ambulatory Visit: Payer: No Typology Code available for payment source

## 2019-06-15 ENCOUNTER — Other Ambulatory Visit (INDEPENDENT_AMBULATORY_CARE_PROVIDER_SITE_OTHER): Payer: Self-pay | Admitting: Nurse Practitioner

## 2019-06-15 DIAGNOSIS — I82452 Acute embolism and thrombosis of left peroneal vein: Secondary | ICD-10-CM

## 2019-06-18 ENCOUNTER — Ambulatory Visit (INDEPENDENT_AMBULATORY_CARE_PROVIDER_SITE_OTHER): Payer: BC Managed Care – PPO | Admitting: Nurse Practitioner

## 2019-06-18 ENCOUNTER — Ambulatory Visit (INDEPENDENT_AMBULATORY_CARE_PROVIDER_SITE_OTHER): Payer: BC Managed Care – PPO

## 2019-06-18 ENCOUNTER — Other Ambulatory Visit: Payer: Self-pay

## 2019-06-18 ENCOUNTER — Encounter (INDEPENDENT_AMBULATORY_CARE_PROVIDER_SITE_OTHER): Payer: Self-pay | Admitting: Nurse Practitioner

## 2019-06-18 VITALS — BP 148/96 | HR 85 | Resp 16 | Wt 351.8 lb

## 2019-06-18 DIAGNOSIS — I82452 Acute embolism and thrombosis of left peroneal vein: Secondary | ICD-10-CM

## 2019-06-18 DIAGNOSIS — E669 Obesity, unspecified: Secondary | ICD-10-CM | POA: Insufficient documentation

## 2019-06-18 DIAGNOSIS — I10 Essential (primary) hypertension: Secondary | ICD-10-CM | POA: Diagnosis not present

## 2019-06-18 DIAGNOSIS — Z8709 Personal history of other diseases of the respiratory system: Secondary | ICD-10-CM | POA: Insufficient documentation

## 2019-06-22 NOTE — Progress Notes (Signed)
Subjective:    Patient ID: Frank Decker, male    DOB: [birth year], 39 y.o.   MRN: [id number] Chief Complaint  Patient presents with  . Follow-up    The patient presented to [facility] on 06/03/2019 with left calf pain and swelling.  The patient was subsequently found to have a DVT within the left peroneal vein.  The DVT appeared to be nonprovoked.  At that time no anticoagulation was started due to this being a distal DVT.  The patient has an allergy to aspirin so that was not advised.  However, the patient return to the emergency room on 06/09/2019.  There was no worsening of DVT found.  Based upon the patient's worsening swelling and pain he was placed on Eliquis.  Since that time the patient has been doing well on Eliquis without issue.  He denies any issues with bleeding.  The patient also has been utilizing medical grade 1 compression to help with the swelling and discomfort.  The patient does endorse some lower extremity pain however he endorses that he has been getting over a recent gout flare as well.  Today noninvasive study showed no evidence of DVT or superficial vein thrombosis.  In the left lower extremity.  Full compressibility within the left vein.   Review of Systems  Cardiovascular: Positive for leg swelling.  Musculoskeletal: Positive for arthralgias and gait problem.  All other systems reviewed and are negative.      Objective:   Physical Exam Vitals reviewed.  HENT:     Head: Normocephalic.  Cardiovascular:     Rate and Rhythm: Normal rate and regular rhythm.     Pulses: Normal pulses.  Musculoskeletal:     Left lower leg: Edema present.  Skin:    Capillary Refill: Capillary refill takes less than 2 seconds.  Neurological:     Mental Status: He is alert and oriented to person, place, and time.  Psychiatric:        Mood and Affect: Mood normal.        Behavior: Behavior normal.        Thought Content: Thought content normal.      Judgment: Judgment normal.     BP (!) 148/96 (BP Location: Right Arm)   Pulse 85   Resp 16   Wt (!) 351 lb 12.8 oz (159.6 kg)   BMI 49.07 kg/m   Past Medical History:  Diagnosis Date  . Hypertension   . Renal disorder   . Sleep apnea     Social History   Socioeconomic History  . Marital status: Married    Spouse name: Not on file  . Number of children: Not on file  . Years of education: Not on file  . Highest education level: Not on file  Occupational History  . Not on file  Tobacco Use  . Smoking status: Former Research scientist (life sciences)  . Smokeless tobacco: Never Used  Substance and Sexual Activity  . Alcohol use: Yes  . Drug use: No  . Sexual activity: Not on file  Other Topics Concern  . Not on file  Social History Narrative  . Not on file   Social Determinants of Health   Financial Resource Strain:   . Difficulty of Paying Living Expenses:   Food Insecurity:   . Worried About Charity fundraiser in the Last Year:   . Arboriculturist in the Last Year:   Transportation Needs:   . Film/video editor (Medical):   Marland Kitchen  Lack of Transportation (Non-Medical):   Physical Activity:   . Days of Exercise per Week:   . Minutes of Exercise per Session:   Stress:   . Feeling of Stress :   Social Connections:   . Frequency of Communication with Friends and Family:   . Frequency of Social Gatherings with Friends and Family:   . Attends Religious Services:   . Active Member of Clubs or Organizations:   . Attends Banker Meetings:   Marland Kitchen Marital Status:   Intimate Partner Violence:   . Fear of Current or Ex-Partner:   . Emotionally Abused:   Marland Kitchen Physically Abused:   . Sexually Abused:     History reviewed. No pertinent surgical history.  Family History  Problem Relation Age of Onset  . Diabetes Paternal Grandfather     Allergies  Allergen Reactions  . Aspirin Rash  . Ibuprofen Rash       Assessment & Plan:   1. Deep venous thrombosis (DVT) of left peroneal  vein, unspecified chronicity (HCC) Today it appears that the patient's DVT has resolved.  However the patient will continue with anticoagulation.  Because this is the patient's first DVT and it involves the distal veins, I recommend an initial anticoagulation.  In 3 months.  However the patient states that his primary care physician has stated that she would prefer that he be on anticoagulation for 6 months.  In which case we will continue anticoagulation for the next 6 months.  Patient is advised to continue to utilize medical grade 1 compression stockings especially when traveling or doing daily activities to prevent worsening swelling or postphlebitic symptoms.  Otherwise, the patient will continue with Eliquis as long as there are no issues.  Advised patient that it is highly unlikely that he will develop another DVT or pulmonary embolism while he is currently on Eliquis however if he has concerns that he may have developed another 1 in contact her office.  Otherwise, we will see him back in 6 months to discuss stopping anticoagulation.  2. Essential hypertension Continue antihypertensive medications as already ordered, these medications have been reviewed and there are no changes at this time.    Current Outpatient Medications on File Prior to Visit  Medication Sig Dispense Refill  . albuterol (VENTOLIN HFA) 108 (90 Base) MCG/ACT inhaler Inhale 2 puffs into the lungs every 6 (six) hours as needed.     Marland Kitchen Apixaban Starter Pack (ELIQUIS STARTER PACK) 5 MG TBPK Take as directed on package: start with two-5mg  tablets twice daily for 7 days. On day 8, switch to one-5mg  tablet twice daily. 1 each 0  . triamterene-hydrochlorothiazide (DYAZIDE) 37.5-25 MG capsule Take 1 capsule by mouth daily.    . cyclobenzaprine (FLEXERIL) 10 MG tablet Take 1 tablet (10 mg total) by mouth 3 (three) times daily as needed for muscle spasms. (Patient not taking: Reported on 06/18/2019) 15 tablet 0  . HYDROcodone-acetaminophen  (NORCO/VICODIN) 5-325 MG tablet 1-2 tabs po bid prn (Patient not taking: Reported on 06/18/2019) 6 tablet 0  . predniSONE (DELTASONE) 20 MG tablet 3 tabs po qd for 2 days, then 2 tabs po qd for 3 days, then 1 tab po qd for 3 days, then half a tab po qd for 2 days (Patient not taking: Reported on 06/18/2019) 16 tablet 0   No current facility-administered medications on file prior to visit.    There are no Patient Instructions on file for this visit. No follow-ups on file.  Kris Hartmann, NP

## 2019-11-30 ENCOUNTER — Other Ambulatory Visit: Payer: Self-pay

## 2019-11-30 ENCOUNTER — Ambulatory Visit (INDEPENDENT_AMBULATORY_CARE_PROVIDER_SITE_OTHER): Payer: BC Managed Care – PPO | Admitting: Internal Medicine

## 2019-11-30 VITALS — BP 123/72 | HR 81 | Ht 70.0 in | Wt 367.0 lb

## 2019-11-30 DIAGNOSIS — G4733 Obstructive sleep apnea (adult) (pediatric): Secondary | ICD-10-CM | POA: Diagnosis not present

## 2019-11-30 DIAGNOSIS — I10 Essential (primary) hypertension: Secondary | ICD-10-CM

## 2019-11-30 NOTE — Progress Notes (Signed)
[facility] [address] [place], [place] [address]  Pulmonary Sleep Medicine   Office Visit Note  Patient Name: Frank Decker DOB: 11-02-1980 MRN [id number]    Chief Complaint: Obstructive Sleep Apnea visit  Brief History:  Hussam is seen today for initial consultation The patient has a 10 year history of sleep apnea. He used CPAP for a little while but the noise bothered him as did the mask. He did not sleep well and stopped using. He is snoring loudly and reports witnessed apneas. He goes to bed between 10:30-11:30 p.m. He believes he sleeps well but when he wakes at 6 a.m. he feels as if he were "hit by a bus".  The Epworth Sleepiness Score is 16 out of 24. He struggles to remain awake at work. The patient does take naps if he has time. The patient does not complain of limb movements disrupting sleep.  ROS  General: (-) fever, (-) chills, (-) night sweat Nose and Sinuses: (-) nasal stuffiness or itchiness, (-) postnasal drip, (-) nosebleeds, (-) sinus trouble. Mouth and Throat: (-) sore throat, (-) hoarseness. Neck: (-) swollen glands, (-) enlarged thyroid, (-) neck pain. Respiratory: - cough, - shortness of breath, - wheezing. Neurologic: - numbness, - tingling. Psychiatric: - anxiety, - depression   Current Medication: Outpatient Encounter Medications as of 11/30/2019  Medication Sig  . albuterol (VENTOLIN HFA) 108 (90 Base) MCG/ACT inhaler Inhale 2 puffs into the lungs every 6 (six) hours as needed.   Marland Kitchen allopurinol (ZYLOPRIM) 300 MG tablet Take 600 mg by mouth daily.  Marland Kitchen olmesartan (BENICAR) 20 MG tablet Take 20 mg by mouth daily.  . predniSONE (DELTASONE) 20 MG tablet 3 tabs po qd for 2 days, then 2 tabs po qd for 3 days, then 1 tab po qd for 3 days, then half a tab po qd for 2 days (Patient not taking: Reported on 06/18/2019)  . triamterene-hydrochlorothiazide (DYAZIDE) 37.5-25 MG capsule Take 1 capsule by mouth daily.  . [DISCONTINUED] Apixaban Starter Pack  (ELIQUIS STARTER PACK) 5 MG TBPK Take as directed on package: start with two-5mg  tablets twice daily for 7 days. On day 8, switch to one-5mg  tablet twice daily.  . [DISCONTINUED] cyclobenzaprine (FLEXERIL) 10 MG tablet Take 1 tablet (10 mg total) by mouth 3 (three) times daily as needed for muscle spasms. (Patient not taking: Reported on 06/18/2019)  . [DISCONTINUED] HYDROcodone-acetaminophen (NORCO/VICODIN) 5-325 MG tablet 1-2 tabs po bid prn (Patient not taking: Reported on 06/18/2019)   No facility-administered encounter medications on file as of 11/30/2019.    Surgical History: No past surgical history on file.  Medical History: Past Medical History:  Diagnosis Date  . Hypertension   . Renal disorder   . Sleep apnea     Family History: Non contributory to the present illness  Social History: Social History   Socioeconomic History  . Marital status: Married    Spouse name: Not on file  . Number of children: Not on file  . Years of education: Not on file  . Highest education level: Not on file  Occupational History  . Not on file  Tobacco Use  . Smoking status: Former Smoker    Types: Cigarettes    Quit date: 03/12/2005    Years since quitting: 14.7  . Smokeless tobacco: Never Used  Substance and Sexual Activity  . Alcohol use: Yes  . Drug use: No  . Sexual activity: Not on file  Other Topics Concern  . Not on file  Social History Narrative  . Not on file   Social Determinants of Health   Financial Resource Strain:   . Difficulty of Paying Living Expenses: Not on file  Food Insecurity:   . Worried About Programme researcher, broadcasting/film/video in the Last Year: Not on file  . Ran Out of Food in the Last Year: Not on file  Transportation Needs:   . Lack of Transportation (Medical): Not on file  . Lack of Transportation (Non-Medical): Not on file  Physical Activity:   . Days of Exercise per Week: Not on file  . Minutes of Exercise per Session: Not on file  Stress:   . Feeling of  Stress : Not on file  Social Connections:   . Frequency of Communication with Friends and Family: Not on file  . Frequency of Social Gatherings with Friends and Family: Not on file  . Attends Religious Services: Not on file  . Active Member of Clubs or Organizations: Not on file  . Attends Banker Meetings: Not on file  . Marital Status: Not on file  Intimate Partner Violence:   . Fear of Current or Ex-Partner: Not on file  . Emotionally Abused: Not on file  . Physically Abused: Not on file  . Sexually Abused: Not on file    Vital Signs: Blood pressure 123/72, pulse 81, height 5\' 10"  (1.778 m), weight (!) 367 lb (166.5 kg), SpO2 96 %.  Examination: General Appearance: The patient is well-developed, well-nourished, and in no distress. Neck Circumference: 51 Skin: Gross inspection of skin unremarkable. Head: normocephalic, no gross deformities. Eyes: no gross deformities noted. ENT: ears appear grossly normal Neurologic: Alert and oriented. No involuntary movements.    EPWORTH SLEEPINESS SCALE:  Scale:  (0)= no chance of dozing; (1)= slight chance of dozing; (2)= moderate chance of dozing; (3)= high chance of dozing  Chance  Situtation    Sitting and reading: 3    Watching TV: 2    Sitting Inactive in public: 2    As a passenger in car: 2      Lying down to rest: 3    Sitting and talking: 1    Sitting quielty after lunch: 2    In a car, stopped in traffic: 1   TOTAL SCORE:   16 out of 24    SLEEP STUDIES:  1. Not available   CPAP COMPLIANCE DATA:  Is no longer on CPAP  LABS: No results found for this or any previous visit (from the past 2160 hour(s)).  Radiology: 2161 Venous Img Lower Unilateral Left (DVT)  Result Date: 06/09/2019 CLINICAL DATA:  Recent diagnosis of left peroneal vein DVT with persistent left lower leg pain and edema. EXAM: LEFT LOWER EXTREMITY VENOUS DOPPLER ULTRASOUND TECHNIQUE: Gray-scale sonography with graded  compression, as well as color Doppler and duplex ultrasound were performed to evaluate the lower extremity deep venous systems from the level of the common femoral vein and including the common femoral, femoral, profunda femoral, popliteal and calf veins including the posterior tibial, peroneal and gastrocnemius veins when visible. The superficial great saphenous vein was also interrogated. Spectral Doppler was utilized to evaluate flow at rest and with distal augmentation maneuvers in the common femoral, femoral and popliteal veins. COMPARISON:  06/03/2019 FINDINGS: Contralateral Common Femoral Vein: Respiratory phasicity is normal and symmetric with the symptomatic side. No evidence of thrombus. Normal compressibility. Common Femoral Vein: No evidence of thrombus. Normal compressibility, respiratory phasicity and response to augmentation. Saphenofemoral Junction: No evidence  of thrombus. Normal compressibility and flow on color Doppler imaging. Profunda Femoral Vein: No evidence of thrombus. Normal compressibility and flow on color Doppler imaging. Femoral Vein: No evidence of thrombus. Normal compressibility, respiratory phasicity and response to augmentation. Popliteal Vein: No evidence of thrombus. Normal compressibility, respiratory phasicity and response to augmentation. Calf Veins: Occlusive thrombus again identified in the left peroneal vein. No propagation into the popliteal vein or other proximal veins. Superficial Great Saphenous Vein: No evidence of thrombus. Normal compressibility. Venous Reflux:  None. Other Findings: No evidence of superficial thrombophlebitis or abnormal fluid collection. 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 No additional proximal DVT identified. Electronically Signed   By: Irish Lack M.D.   On: 06/09/2019 17:11   Assessment and Plan: Patient Active Problem List   Diagnosis Date Noted  . Hx of chronic bronchitis 06/18/2019  . Obesity 06/18/2019  .  Prediabetes 04/17/2017  . Essential hypertension 12/25/2013  . Nephrolithiasis 12/25/2013  . Sleep apnea 12/25/2013  . Pes planus 06/08/2013  . Plantar fasciitis, bilateral 06/08/2013    The patient did not tolerate PAP  When he had it. He is now reporting symptoms of sleep apnea  With loud snoring, apneas and daytime hypersomnia. He has  A history of hypertension.  A diagnostic sleep study is recommended. He is willing to retry CPAP if needed. He is hoping to have bariatric surgery.   1. OSA- a PSG will be ordered 2. Morbid obesity-Plans to have bariatric surgery. In the meantime encouraged to focus on healthy eating as well as daily exercise. 3. HTN good control needs to follow with PCP  General Counseling: I have discussed the findings of the evaluation and examination with Aarish.  I have also discussed any further diagnostic evaluation thatmay be needed or ordered today. Heath verbalizes understanding of the findings of todays visit. We also reviewed his medications today and discussed drug interactions and side effects including but not limited excessive drowsiness and altered mental states. We also discussed that there is always a risk not just to him but also people around him. he has been encouraged to call the office with any questions or concerns that should arise related to todays visit.    I have personally obtained a history, examined the patient, evaluated laboratory and imaging results, formulated the assessment and plan and placed orders.  This patient was seen by Leeanne Deed AGNP-C in Collaboration with Dr. Freda Munro as a part of collaborative care agreement.  Valentino Hue Sol Blazing, PhD, FAASM  Diplomate, American Board of Sleep Medicine    Yevonne Pax, MD [facility] Diplomate ABMS Pulmonary and Critical Care Medicine Sleep medicine

## 2019-11-30 NOTE — Patient Instructions (Signed)

## 2019-12-15 ENCOUNTER — Ambulatory Visit (INDEPENDENT_AMBULATORY_CARE_PROVIDER_SITE_OTHER): Payer: BC Managed Care – PPO | Admitting: Vascular Surgery

## 2019-12-30 ENCOUNTER — Encounter (INDEPENDENT_AMBULATORY_CARE_PROVIDER_SITE_OTHER): Payer: Self-pay | Admitting: Vascular Surgery

## 2020-01-04 ENCOUNTER — Ambulatory Visit (INDEPENDENT_AMBULATORY_CARE_PROVIDER_SITE_OTHER): Payer: BC Managed Care – PPO | Admitting: Internal Medicine

## 2020-01-04 VITALS — BP 154/87 | HR 85 | Ht 70.0 in | Wt 360.0 lb

## 2020-01-04 DIAGNOSIS — G4733 Obstructive sleep apnea (adult) (pediatric): Secondary | ICD-10-CM

## 2020-01-04 DIAGNOSIS — Z7189 Other specified counseling: Secondary | ICD-10-CM

## 2020-01-04 DIAGNOSIS — I10 Essential (primary) hypertension: Secondary | ICD-10-CM

## 2020-01-04 NOTE — Patient Instructions (Signed)

## 2020-01-04 NOTE — Progress Notes (Signed)
Sleep Medicine   Office Visit  Patient Name: Frank Decker DOB: 05/05/1980 MRN [id number]    Chief Complaint: sleep apnea   HISTORY OF PRESENT ILLNESS: Frank Decker is seen today for follow up to discuss the results of his recent sleep studies demonstrating moderate obstructive sleep apnea with severe desats. CPAP 13 cm H2O was recommended. He is very sleepy with an Epworth sleepiness Score of 18 out of 24. ROS  General: (-) fever, (-) chills, (-) night sweat Nose and Sinuses: (-) nasal stuffiness or itchiness, (-) postnasal drip, (-) nosebleeds, (-) sinus trouble. Mouth and Throat: (-) sore throat, (-) hoarseness. Neck: (-) swollen glands, (-) enlarged thyroid, (-) neck pain. Respiratory: - cough, - shortness of breath, - wheezing. Neurologic: - numbness, - tingling. Psychiatric: - anxiety, - depression  Current Medication: Outpatient Encounter Medications as of 01/04/2020  Medication Sig   atorvastatin (LIPITOR) 20 MG tablet Take by mouth.   ergocalciferol (VITAMIN D2) 1.25 MG (50000 UT) capsule Take by mouth.   metFORMIN (GLUCOPHAGE-XR) 500 MG 24 hr tablet Take by mouth.   albuterol (VENTOLIN HFA) 108 (90 Base) MCG/ACT inhaler Inhale 2 puffs into the lungs every 6 (six) hours as needed.    allopurinol (ZYLOPRIM) 300 MG tablet Take 600 mg by mouth daily.   olmesartan (BENICAR) 20 MG tablet Take 20 mg by mouth daily.   [DISCONTINUED] predniSONE (DELTASONE) 20 MG tablet 3 tabs po qd for 2 days, then 2 tabs po qd for 3 days, then 1 tab po qd for 3 days, then half a tab po qd for 2 days (Patient not taking: Reported on 06/18/2019)   [DISCONTINUED] triamterene-hydrochlorothiazide (DYAZIDE) 37.5-25 MG capsule Take 1 capsule by mouth daily.   No facility-administered encounter medications on file as of 01/04/2020.    Surgical History: History reviewed. No pertinent surgical history.  Medical History: Past Medical History:  Diagnosis Date   Hypertension    Renal disorder     Sleep apnea     Family History: Non contributory to the present illness  Social History: Social History   Socioeconomic History   Marital status: Married    Spouse name: Not on file   Number of children: Not on file   Years of education: Not on file   Highest education level: Not on file  Occupational History   Not on file  Tobacco Use   Smoking status: Former Smoker    Types: Cigarettes    Quit date: 03/12/2005    Years since quitting: 14.8   Smokeless tobacco: Never Used  Substance and Sexual Activity   Alcohol use: Yes   Drug use: No   Sexual activity: Not on file  Other Topics Concern   Not on file  Social History Narrative   Not on file   Social Determinants of Health   Financial Resource Strain:    Difficulty of Paying Living Expenses: Not on file  Food Insecurity:    Worried About Programme researcher, broadcasting/film/video in the Last Year: Not on file   [employer] of Food in the Last Year: Not on file  Transportation Needs:    Lack of Transportation (Medical): Not on file   Lack of Transportation (Non-Medical): Not on file  Physical Activity:    Days of Exercise per Week: Not on file   Minutes of Exercise per Session: Not on file  Stress:    Feeling of Stress : Not on file  Social Connections:    Frequency of Communication with Friends and Family:  Not on file   Frequency of Social Gatherings with Friends and Family: Not on file   Attends Religious Services: Not on file   Active Member of Clubs or Organizations: Not on file   Attends Banker Meetings: Not on file   Marital Status: Not on file  Intimate Partner Violence:    Fear of Current or Ex-Partner: Not on file   Emotionally Abused: Not on file   Physically Abused: Not on file   Sexually Abused: Not on file    Vital Signs: Blood pressure (!) 154/87, pulse 85, height 5\' 10"  (1.778 m), weight (!) 360 lb (163.3 kg), SpO2 95 %.  Examination: General Appearance: The patient is  well-developed, well-nourished, and in no distress. Neck Circumference: 49 Skin: Gross inspection of skin unremarkable. Head: normocephalic, no gross deformities. Eyes: no gross deformities noted. ENT: ears appear grossly normal Neurologic: Alert and oriented. No involuntary movements.    EPWORTH SLEEPINESS SCALE:  Scale:  (0)= no chance of dozing; (1)= slight chance of dozing; (2)= moderate chance of dozing; (3)= high chance of dozing  Chance  Situtation    Sitting and reading: 3    Watching TV: 2    Sitting Inactive in public: 3    As a passenger in car: 2      Lying down to rest: 3    Sitting and talking: 1    Sitting quielty after lunch: 2    In a car, stopped in traffic: 2   TOTAL SCORE:   18 out of 24    SLEEP STUDIES:  1. PSG 12/23/19 AHI 16 (REM apnea and decreased REM) SpO43min 55%   LABS: No results found for this or any previous visit (from the past 2160 hour(s)).  Radiology: 2161 Venous Img Lower Unilateral Left (DVT)  Result Date: 06/09/2019 CLINICAL DATA:  Recent diagnosis of left peroneal vein DVT with persistent left lower leg pain and edema. EXAM: LEFT LOWER EXTREMITY VENOUS DOPPLER ULTRASOUND TECHNIQUE: Gray-scale sonography with graded compression, as well as color Doppler and duplex ultrasound were performed to evaluate the lower extremity deep venous systems from the level of the common femoral vein and including the common femoral, femoral, profunda femoral, popliteal and calf veins including the posterior tibial, peroneal and gastrocnemius veins when visible. The superficial great saphenous vein was also interrogated. Spectral Doppler was utilized to evaluate flow at rest and with distal augmentation maneuvers in the common femoral, femoral and popliteal veins. COMPARISON:  06/03/2019 FINDINGS: Contralateral Common Femoral Vein: Respiratory phasicity is normal and symmetric with the symptomatic side. No evidence of thrombus. Normal compressibility.  Common Femoral Vein: No evidence of thrombus. Normal compressibility, respiratory phasicity and response to augmentation. Saphenofemoral Junction: No evidence of thrombus. Normal compressibility and flow on color Doppler imaging. Profunda Femoral Vein: No evidence of thrombus. Normal compressibility and flow on color Doppler imaging. Femoral Vein: No evidence of thrombus. Normal compressibility, respiratory phasicity and response to augmentation. Popliteal Vein: No evidence of thrombus. Normal compressibility, respiratory phasicity and response to augmentation. Calf Veins: Occlusive thrombus again identified in the left peroneal vein. No propagation into the popliteal vein or other proximal veins. Superficial Great Saphenous Vein: No evidence of thrombus. Normal compressibility. Venous Reflux:  None. Other Findings: No evidence of superficial thrombophlebitis or abnormal fluid collection. 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 No additional proximal DVT identified. Electronically Signed   By: 06/05/2019 M.D.   On: 06/09/2019 17:11  Assessment and Plan: Patient Active Problem  List   Diagnosis Date Noted   Hx of chronic bronchitis 06/18/2019   Obesity 06/18/2019   Prediabetes 04/17/2017   Essential hypertension 12/25/2013   Nephrolithiasis 12/25/2013   Sleep apnea 12/25/2013   Pes planus 06/08/2013   Plantar fasciitis, bilateral 06/08/2013     PLAN OSA:   The study results, diagnosis and treatment recommendations were discussed with the patient. He has moderate sleep apnea but this may be an underestimation. Very severe oxygen desaturations were observed. A trial of CPAP is recommended. He reports sleeping very well on CPAP and waking feeling rested after his night in the lab.    1. OSA- CPAP 13 cm H2O will be ordered. Follow up 30+ days after set up. 2. Morbid obesity-Plans to possibly have bariatric surgery, will closely follow during process in regards to OSA  and CPAP therapy 3. HTN: BP elevated today, encouraged to follow-up with PCP for elevated readings.   Obesity Counseling: Risk Assessment: An assessment of behavioral risk factors was made today and includes lack of exercise sedentary lifestyle, lack of portion control and poor dietary habits.  Risk Modification Advice: She was counseled on portion control guidelines. Restricting daily caloric intake to. . The detrimental long term effects of obesity on her health and ongoing poor compliance was also discussed with the patient.  Hypertension Counseling:   The following hypertensive lifestyle modification were recommended and discussed:  1. Limiting alcohol intake to less than 1 oz/day of ethanol:(24 oz of beer or 8 oz of wine or 2 oz of 100-proof whiskey). 2. Take baby ASA 81 mg daily. 3. Importance of regular aerobic exercise and losing weight. 4. Reduce dietary saturated fat and cholesterol intake for overall cardiovascular health. 5. Maintaining adequate dietary potassium, calcium, and magnesium intake. 6. Regular monitoring of the blood pressure. 7. Reduce sodium intake to less than 100 mmol/day (less than 2.3 gm of sodium or less than 6 gm of sodium choride)   General Counseling: I have discussed the findings of the evaluation and examination with Traci.  I have also discussed any further diagnostic evaluation thatmay be needed or ordered today. Alante verbalizes understanding of the findings of todays visit. We also reviewed his medications today and discussed drug interactions and side effects including but not limited excessive drowsiness and altered mental states. We also discussed that there is always a risk not just to him but also people around him. he has been encouraged to call the office with any questions or concerns that should arise related to todays visit.  I have personally obtained a history, evaluated the patient, evaluated pertinent data, formulated the assessment and plan and  placed orders.  This patient was seen by Layla Barter, AGNP-C in collaboration with Dr. Freda Munro as a part of collaborative care agreement.  Valentino Hue Sol Blazing, PhD, FAASM  Diplomate, American Board of Sleep Medicine    Yevonne Pax, MD [facility] Diplomate ABMS Pulmonary and Critical Care Medicine Sleep medicine

## 2020-02-08 ENCOUNTER — Ambulatory Visit: Payer: BC Managed Care – PPO | Admitting: Internal Medicine

## 2020-02-08 NOTE — Progress Notes (Signed)
Patient was no-show for appointment.  The office staff will contact the patient for rescheduling follow-up. 

## 2020-02-22 ENCOUNTER — Ambulatory Visit: Payer: BC Managed Care – PPO | Admitting: Internal Medicine

## 2020-02-22 NOTE — Progress Notes (Signed)
Patient was no-show for appointment.  The office staff will contact the patient for rescheduling follow-up. 

## 2020-03-14 ENCOUNTER — Ambulatory Visit (INDEPENDENT_AMBULATORY_CARE_PROVIDER_SITE_OTHER): Payer: BC Managed Care – PPO | Admitting: Internal Medicine

## 2020-03-14 VITALS — BP 153/89 | HR 80 | Temp 99.1°F | Resp 16 | Ht 70.0 in | Wt 360.0 lb

## 2020-03-14 DIAGNOSIS — I10 Essential (primary) hypertension: Secondary | ICD-10-CM | POA: Diagnosis not present

## 2020-03-14 DIAGNOSIS — Z6841 Body Mass Index (BMI) 40.0 and over, adult: Secondary | ICD-10-CM

## 2020-03-14 DIAGNOSIS — G4733 Obstructive sleep apnea (adult) (pediatric): Secondary | ICD-10-CM | POA: Diagnosis not present

## 2020-03-14 DIAGNOSIS — Z7189 Other specified counseling: Secondary | ICD-10-CM | POA: Insufficient documentation

## 2020-03-14 DIAGNOSIS — Z9989 Dependence on other enabling machines and devices: Secondary | ICD-10-CM

## 2020-03-14 NOTE — Progress Notes (Unsigned)
[facility] [place], [place] [address]  Pulmonary Sleep Medicine   Office Visit Note  Patient Name: Frank Decker DOB: 1980-05-19 MRN [id number]    Chief Complaint: Obstructive Sleep Apnea visit  Brief History:  Klaus is seen today for his first follow up after starting on CPAP. The patient has a 11 year history of sleep apnea. Patient is not using PAP nightly although he is doing better with it than in the past. He forgot to take the  Machine with him. The patient feels more rested after sleeping with PAP.  The patient repiorts benefiting from PAP use. Reported sleepiness is  improved and the Epworth Sleepiness Score is 5 out of 24. He still removes the mask if he wakes earlyThe patient does not take naps. The patient complains of the following: dry mouth and nose. He has not adjusted the humidifier.  The compliance download shows poor  compliance with an average use time of 4.8 hours. The AHI is 0.9  The patient does not of limb movements disrupting sleep.  ROS  General: (-) fever, (-) chills, (-) night sweat Nose and Sinuses: (-) nasal stuffiness or itchiness, (-) postnasal drip, (-) nosebleeds, (-) sinus trouble. Mouth and Throat: (-) sore throat, (-) hoarseness. Neck: (-) swollen glands, (-) enlarged thyroid, (-) neck pain. Respiratory: - cough, - shortness of breath, - wheezing. Neurologic: - numbness, - tingling. Psychiatric: - anxiety, - depression   Current Medication: Outpatient Encounter Medications as of 03/14/2020  Medication Sig  . albuterol (VENTOLIN HFA) 108 (90 Base) MCG/ACT inhaler Inhale 2 puffs into the lungs every 6 (six) hours as needed.   Marland Kitchen allopurinol (ZYLOPRIM) 300 MG tablet Take 600 mg by mouth daily.  Marland Kitchen atorvastatin (LIPITOR) 20 MG tablet Take by mouth.  . metFORMIN (GLUCOPHAGE-XR) 500 MG 24 hr tablet Take by mouth.  . olmesartan (BENICAR) 20 MG tablet Take 20 mg by mouth daily.   No facility-administered encounter medications on  file as of 03/14/2020.    Surgical History: History reviewed. No pertinent surgical history.  Medical History: Past Medical History:  Diagnosis Date  . Hypertension   . Renal disorder   . Sleep apnea     Family History: Non contributory to the present illness  Social History: Social History   Socioeconomic History  . Marital status: Married    Spouse name: Not on file  . Number of children: Not on file  . Years of education: Not on file  . Highest education level: Not on file  Occupational History  . Not on file  Tobacco Use  . Smoking status: Former Smoker    Types: Cigarettes    Quit date: 03/12/2005    Years since quitting: 15.0  . Smokeless tobacco: Never Used  Substance and Sexual Activity  . Alcohol use: Yes    Alcohol/week: 1.0 standard drink    Types: 1 Shots of liquor per week    Comment: 5 drinnks monthly  . Drug use: No  . Sexual activity: Not on file  Other Topics Concern  . Not on file  Social History Narrative  . Not on file   Social Determinants of Health   Financial Resource Strain: Not on file  Food Insecurity: Not on file  Transportation Needs: Not on file  Physical Activity: Not on file  Stress: Not on file  Social Connections: Not on file  Intimate Partner Violence: Not on file    Vital Signs: Blood pressure (!) 153/89, pulse 80, temperature  99.1 F (37.3 C), temperature source Temporal, resp. rate 16, height 5\' 10"  (1.778 m), weight (!) 360 lb (163.3 kg), SpO2 97 %.  Examination: General Appearance: The patient is well-developed, well-nourished, and in no distress. Neck Circumference: 49 Skin: Gross inspection of skin unremarkable. Head: normocephalic, no gross deformities. Eyes: no gross deformities noted. ENT: ears appear grossly normal Neurologic: Alert and oriented. No involuntary movements.    EPWORTH SLEEPINESS SCALE:  Scale:  (0)= no chance of dozing; (1)= slight chance of dozing; (2)= moderate chance of dozing; (3)=  high chance of dozing  Chance  Situtation    Sitting and reading: 1    Watching TV: 1    Sitting Inactive in public: 1    As a passenger in car: 1      Lying down to rest: 1    Sitting and talking: 0    Sitting quielty after lunch: 1    In a car, stopped in traffic: 0   TOTAL SCORE:   6 out of 24    SLEEP STUDIES:  1. PSG 12/23/19 AHI 16 SpO68min 55%   CPAP COMPLIANCE DATA:  Date Range: 02/13/20 - 03/13/20  Average Daily Use: 4  hours  Median Use: 5:06  Compliance for > 4 Hours: 67%  AHI: 0.9 respiratory events per hour  Days Used: 25/30 days  Mask Leak: 35.0  95th Percentile Pressure: 13 cmH2O         LABS: No results found for this or any previous visit (from the past 2160 hour(s)).  Radiology: 2161 Venous Img Lower Unilateral Left (DVT)  Result Date: 06/09/2019 CLINICAL DATA:  Recent diagnosis of left peroneal vein DVT with persistent left lower leg pain and edema. EXAM: LEFT LOWER EXTREMITY VENOUS DOPPLER ULTRASOUND TECHNIQUE: Gray-scale sonography with graded compression, as well as color Doppler and duplex ultrasound were performed to evaluate the lower extremity deep venous systems from the level of the common femoral vein and including the common femoral, femoral, profunda femoral, popliteal and calf veins including the posterior tibial, peroneal and gastrocnemius veins when visible. The superficial great saphenous vein was also interrogated. Spectral Doppler was utilized to evaluate flow at rest and with distal augmentation maneuvers in the common femoral, femoral and popliteal veins. COMPARISON:  06/03/2019 FINDINGS: Contralateral Common Femoral Vein: Respiratory phasicity is normal and symmetric with the symptomatic side. No evidence of thrombus. Normal compressibility. Common Femoral Vein: No evidence of thrombus. Normal compressibility, respiratory phasicity and response to augmentation. Saphenofemoral Junction: No evidence of thrombus. Normal  compressibility and flow on color Doppler imaging. Profunda Femoral Vein: No evidence of thrombus. Normal compressibility and flow on color Doppler imaging. Femoral Vein: No evidence of thrombus. Normal compressibility, respiratory phasicity and response to augmentation. Popliteal Vein: No evidence of thrombus. Normal compressibility, respiratory phasicity and response to augmentation. Calf Veins: Occlusive thrombus again identified in the left peroneal vein. No propagation into the popliteal vein or other proximal veins. Superficial Great Saphenous Vein: No evidence of thrombus. Normal compressibility. Venous Reflux:  None. Other Findings: No evidence of superficial thrombophlebitis or abnormal fluid collection. 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 No additional proximal DVT identified. Electronically Signed   By: 06/05/2019 M.D.   On: 06/09/2019 17:11    No results found.  No results found.    Assessment and Plan: Patient Active Problem List   Diagnosis Date Noted  . CPAP use counseling 03/14/2020  . OSA on CPAP 03/14/2020  . Hx of chronic bronchitis 06/18/2019  .  Obesity 06/18/2019  . Prediabetes 04/17/2017  . Essential hypertension 12/25/2013  . Nephrolithiasis 12/25/2013  . Sleep apnea 12/25/2013  . Pes planus 06/08/2013  . Plantar fasciitis, bilateral 06/08/2013      The patient does tolerate PAP and reports significant benefit from PAP use. HE has forgotten the machine a few times and removes the mask early. He was reminded of the importance of using CPAP all night and every night The patient was reminded how to increase the humidifier setting and advised to ensure adequate sleep time. The patient was also counselled on the importance of regular exercise and a good diet. The compliance is poor. The apnea is well controlled..   1. OSA- use CPAP all  Night every night. 2. CPAP counseling- Pt reports good compliance with CPAP therapy. Cleaning machine by  hand, and changing filters and tubing as directed. Denies headaches, sinus issues, palpitations, or hemoptysis.   3. Obesity- discussed weight loss and calorie and activity importance with patient, recommended he work on this.  4. Hypertension- discussed elevated blood pressure in the office today. Advised he monitor his blood pressures at home and report readings to PCP.   General Counseling: I have discussed the findings of the evaluation and examination with Ritchie.  I have also discussed any further diagnostic evaluation thatmay be needed or ordered today. Kinnie verbalizes understanding of the findings of todays visit. We also reviewed his medications today and discussed drug interactions and side effects including but not limited excessive drowsiness and altered mental states. We also discussed that there is always a risk not just to him but also people around him. he has been encouraged to call the office with any questions or concerns that should arise related to todays visit.  No orders of the defined types were placed in this encounter.       I have personally obtained a history, examined the patient, evaluated laboratory and imaging results, formulated the assessment and plan and placed orders.This patient was seen today by Emmaline Kluver, PA-C in collaboration with Dr. Freda Munro.   Valentino Hue Sol Blazing, PhD, FAASM  Diplomate, American Board of Sleep Medicine    Yevonne Pax, MD [facility] Diplomate ABMS Pulmonary and Critical Care Medicine Sleep medicine

## 2020-05-16 ENCOUNTER — Ambulatory Visit: Payer: BC Managed Care – PPO | Admitting: Internal Medicine

## 2020-05-16 NOTE — Progress Notes (Unsigned)
Pt canceled his appointment. Office staff will reach out to reschedule.  

## 2021-01-01 IMAGING — US US EXTREM LOW VENOUS*L*
1 series · 13 of 24 positions shown · non-contrast
Comparison: None.

CLINICAL DATA: 38-year-old with left calf pain and swelling.



[Series 1: us extrem low venous*left* · 0.11mm/px · 13 of 44 slices shown]
[im 1/44]
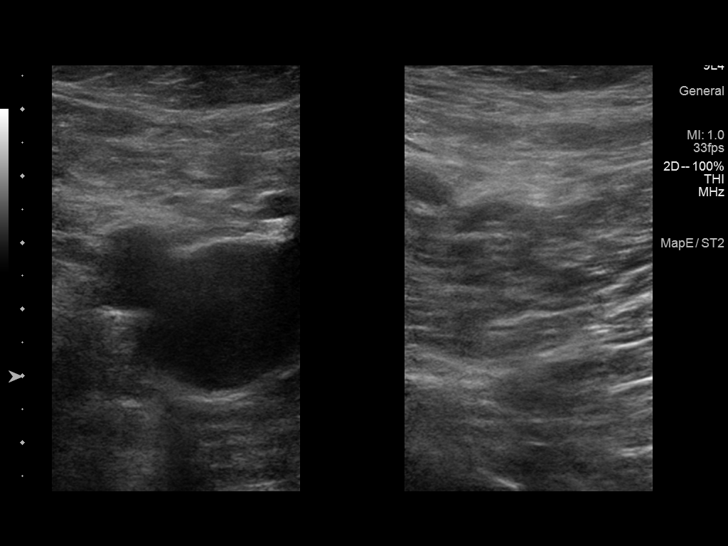
[im 4/44]
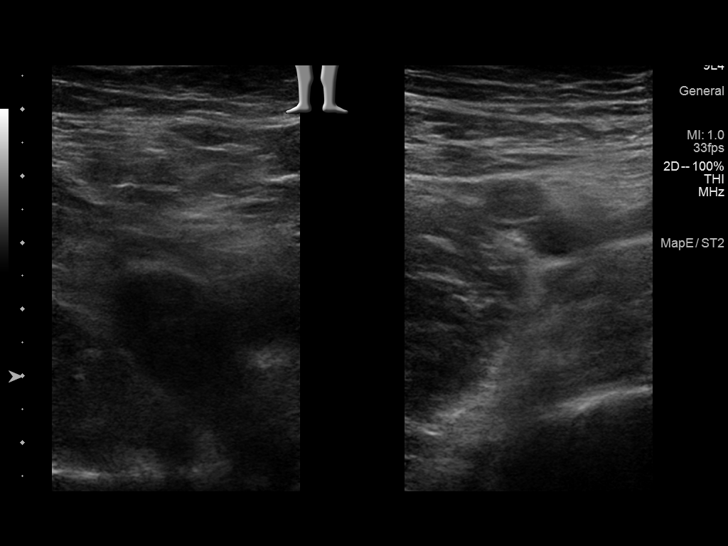
[im 8/44]
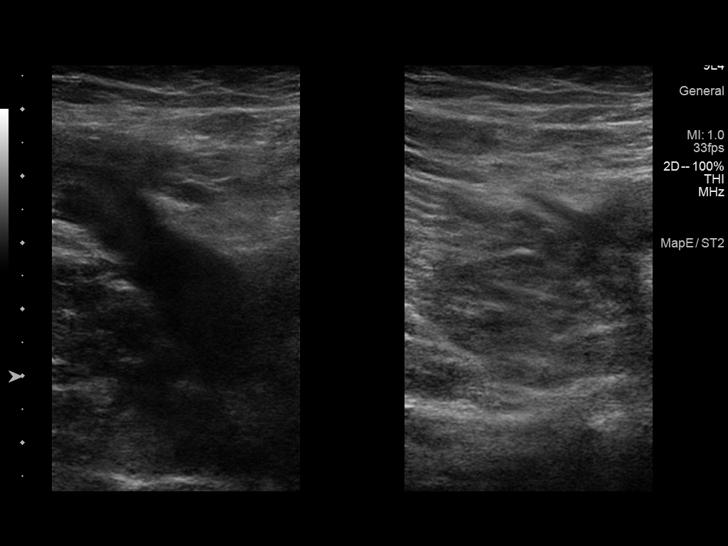
[im 12/44]
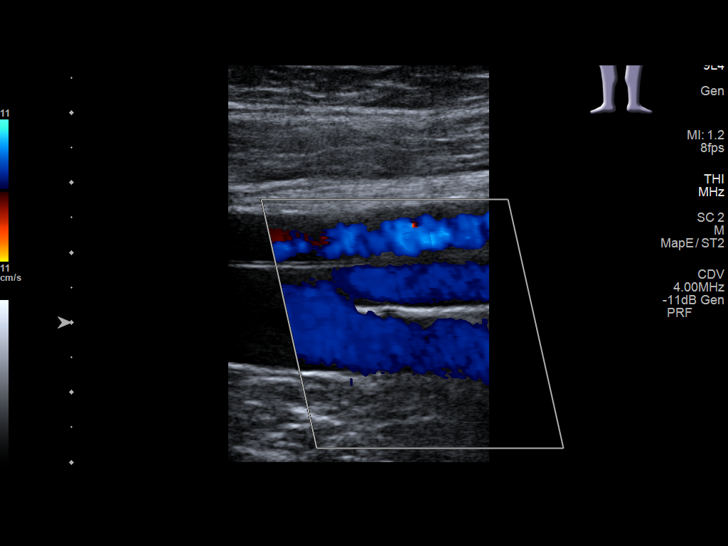
[im 15/44]
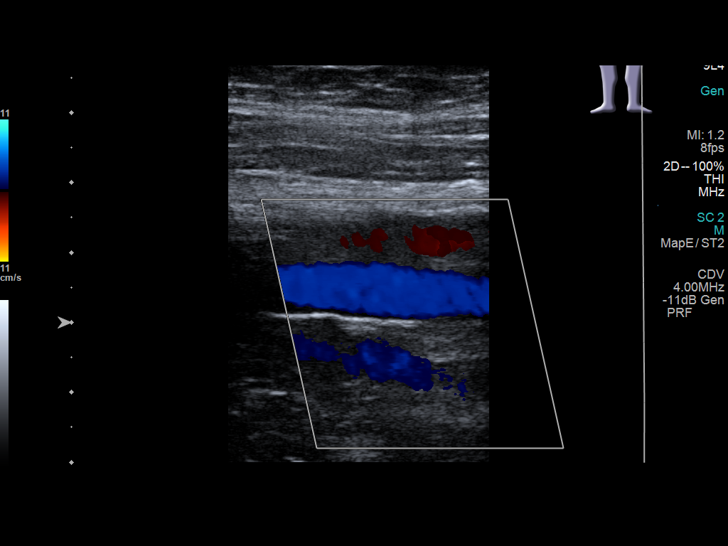
[im 19/44]
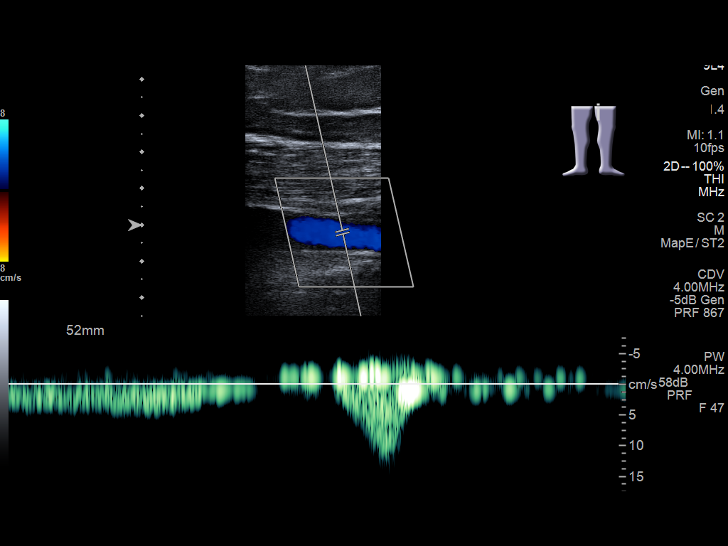
[im 23/44]
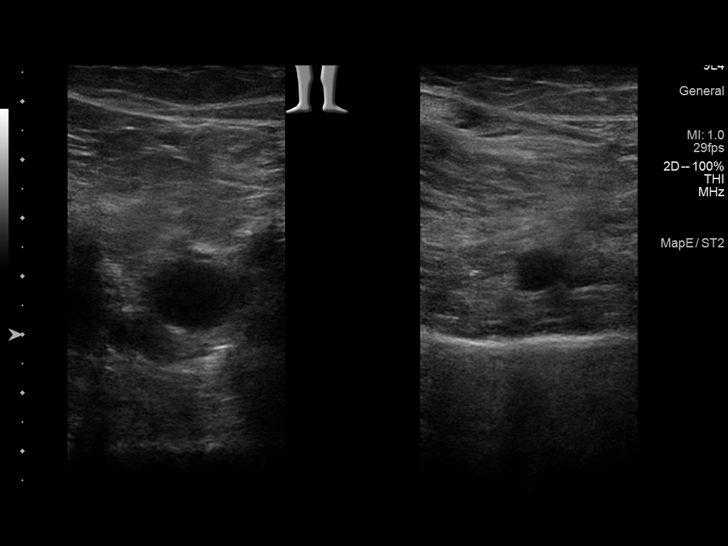
[im 25/44]
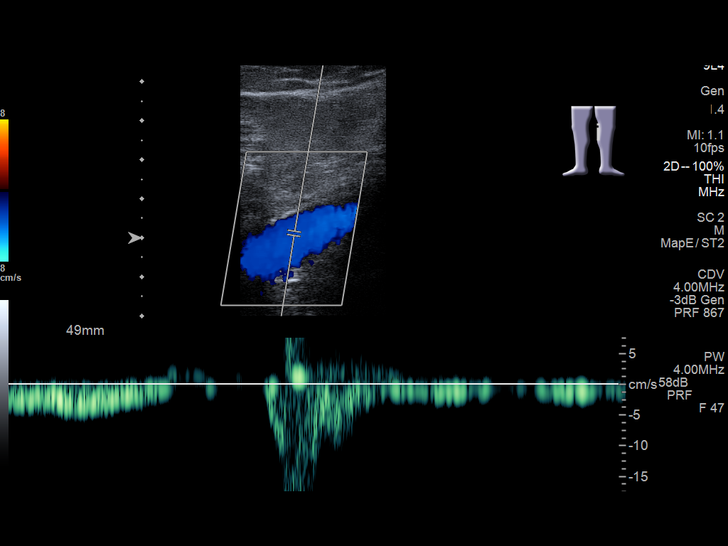
[im 29/44]
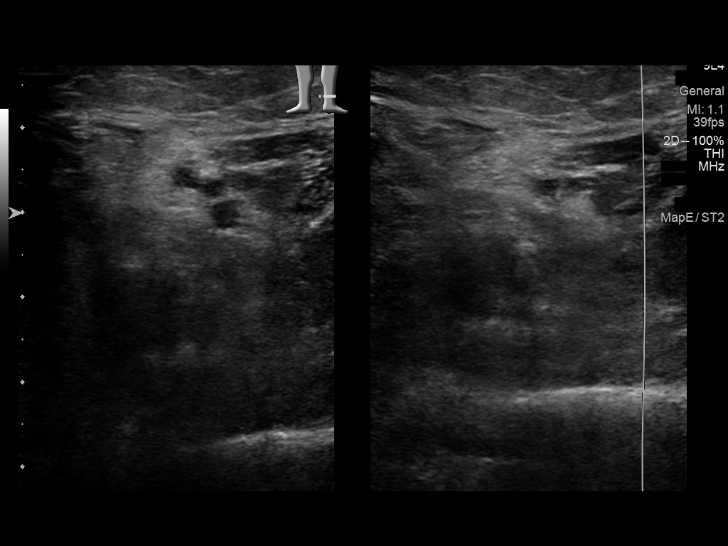
[im 32/44]
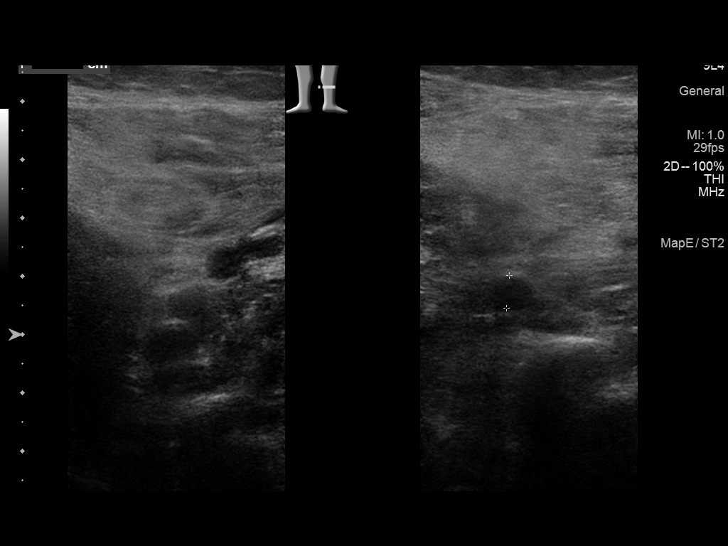
[im 36/44]
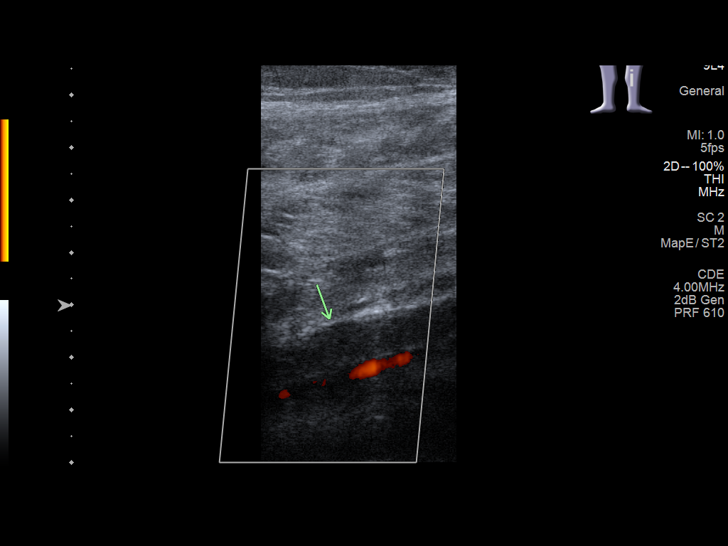
[im 40/44]
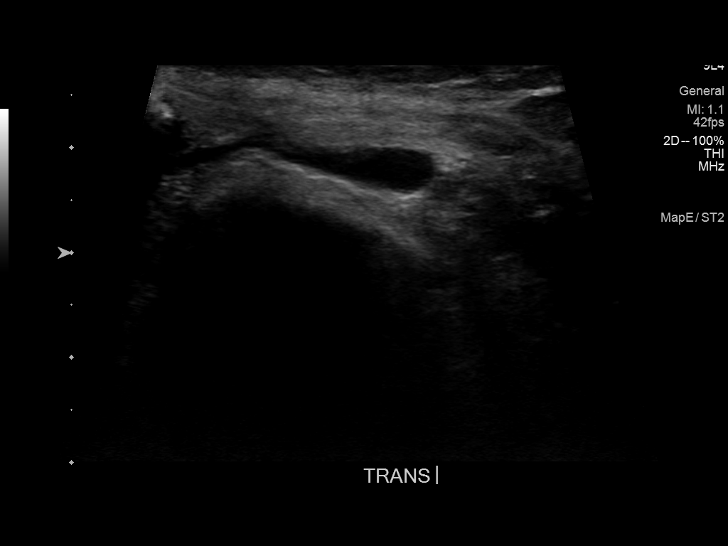
[im 44/44]
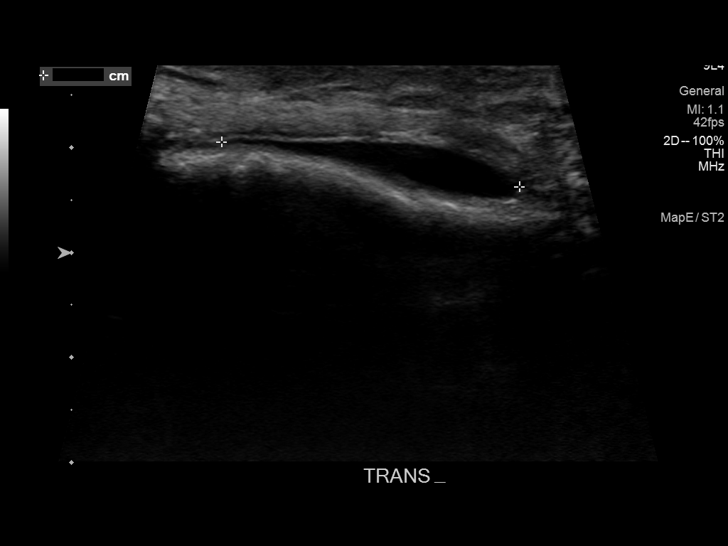

[13 of 24 positions shown; findings below may reference images not displayed]

FINDINGS: Contralateral Common Femoral Vein: Respiratory phasicity is normal
and symmetric with the symptomatic side. No evidence of thrombus.
Normal compressibility.

Common Femoral Vein: No evidence of thrombus. Normal
compressibility, respiratory phasicity and response to augmentation.

Saphenofemoral Junction: No evidence of thrombus. Normal
compressibility and flow on color Doppler imaging.

Profunda Femoral Vein: No evidence of thrombus. Normal
compressibility and flow on color Doppler imaging.

Femoral Vein: No evidence of thrombus. Normal compressibility,
respiratory phasicity and response to augmentation.

Popliteal Vein: No evidence of thrombus. Normal compressibility,
respiratory phasicity and response to augmentation.

Calf Veins: Positive for thrombus. There is a thrombus within 1 of
the peroneal veins. No evidence of thrombus in the posterior tibial
veins.

Other Findings: Small amount of fluid along the left lateral
anterior knee at the area of pain. This fluid collection measures
4.5 x 0.4 x 2.9 cm.
IMPRESSION: Positive for deep venous thrombosis in the left lower extremity.
Thrombus involving a left peroneal vein. No evidence for deep venous
thrombosis above the left knee.

Small fluid collection along the anterolateral left knee at the area
of pain.

## 2021-01-07 IMAGING — US US EXTREM LOW VENOUS*L*
1 series · 13 of 24 positions shown · non-contrast
Comparison: 06/03/2019

CLINICAL DATA: Recent diagnosis of left peroneal vein DVT with
persistent left lower leg pain and edema.



[Series 1: us extrem low venous*left* · 0.10mm/px · 13 of 37 slices shown]
[im 1/37]
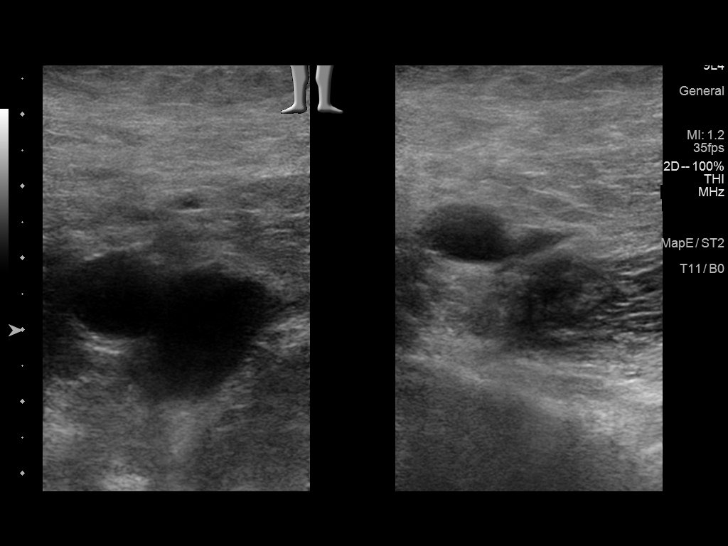
[im 4/37]
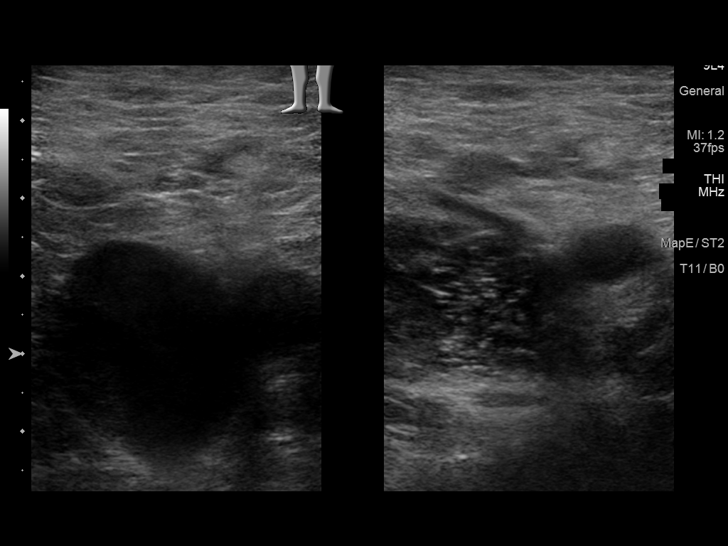
[im 7/37]
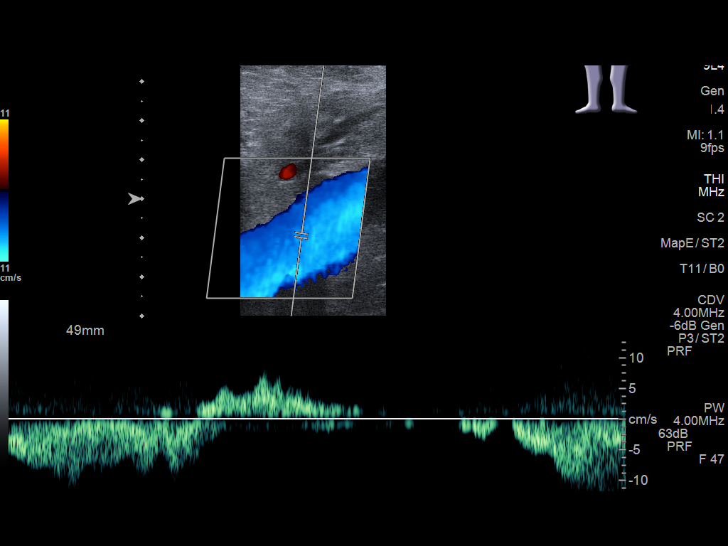
[im 10/37]
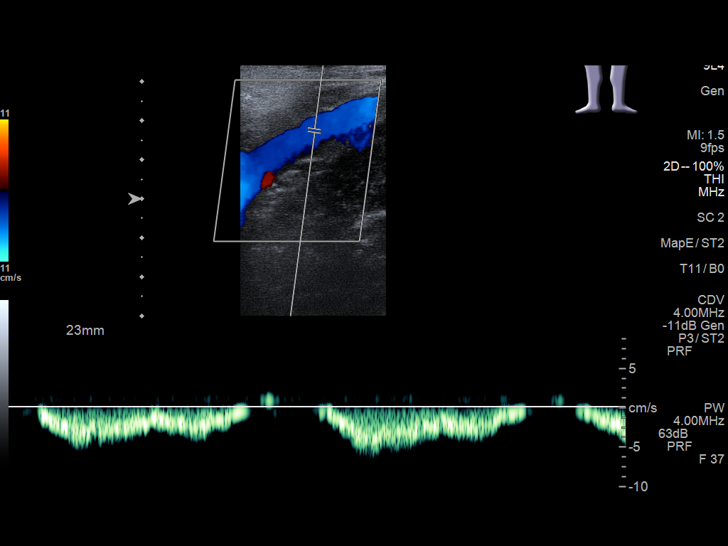
[im 13/37]
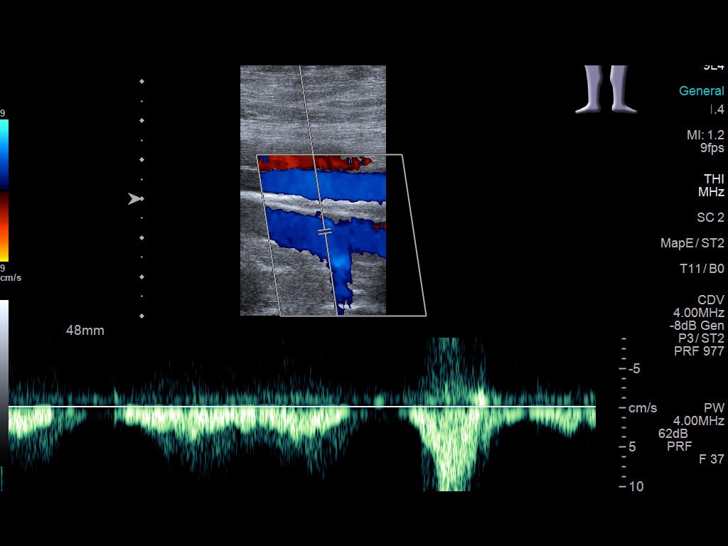
[im 16/37]
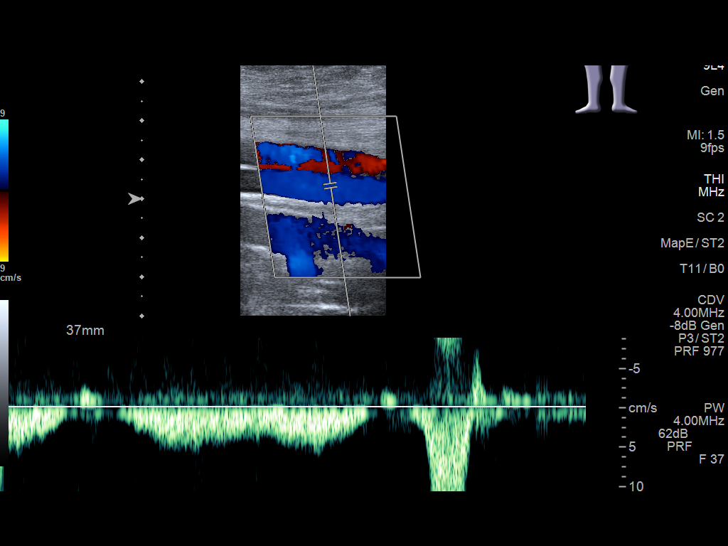
[im 19/37]
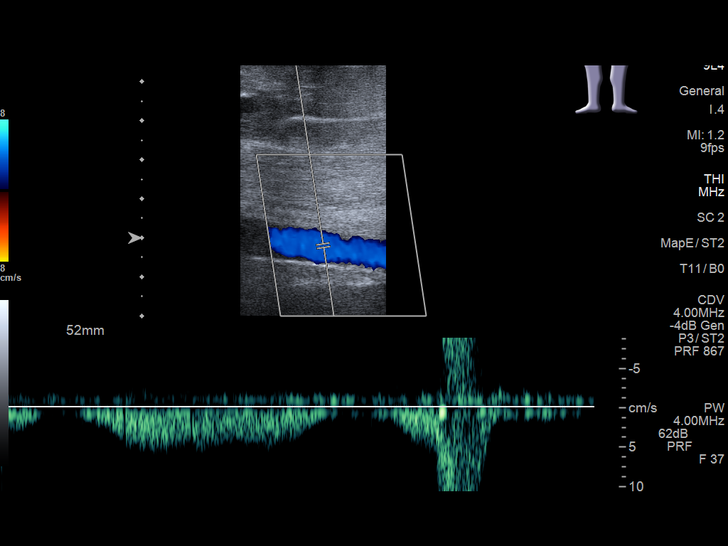
[im 21/37]
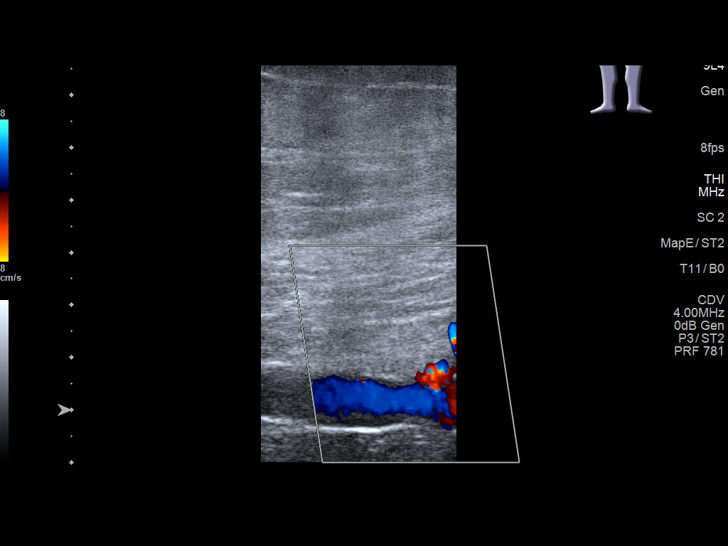
[im 24/37]
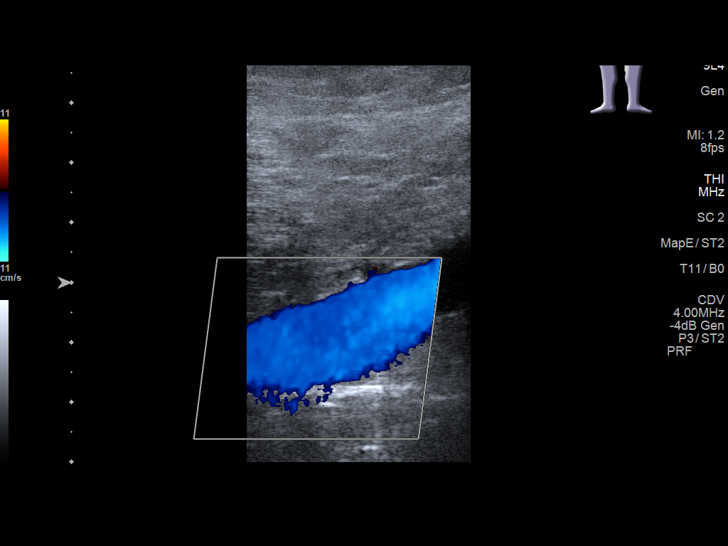
[im 27/37]
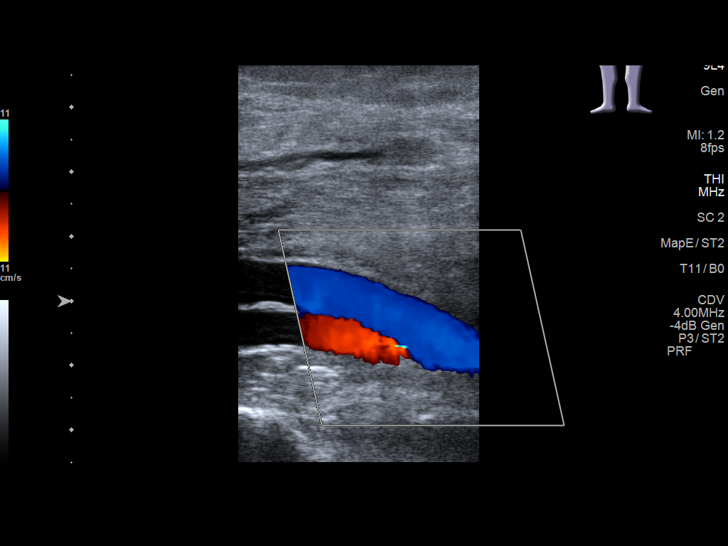
[im 30/37]
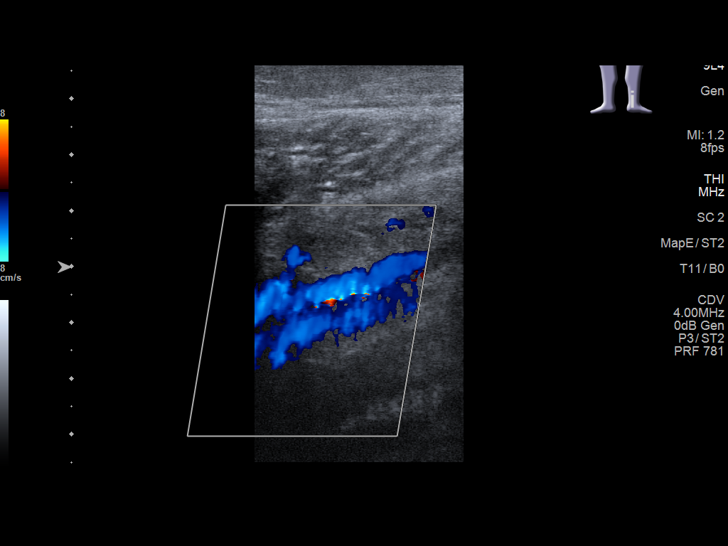
[im 33/37]
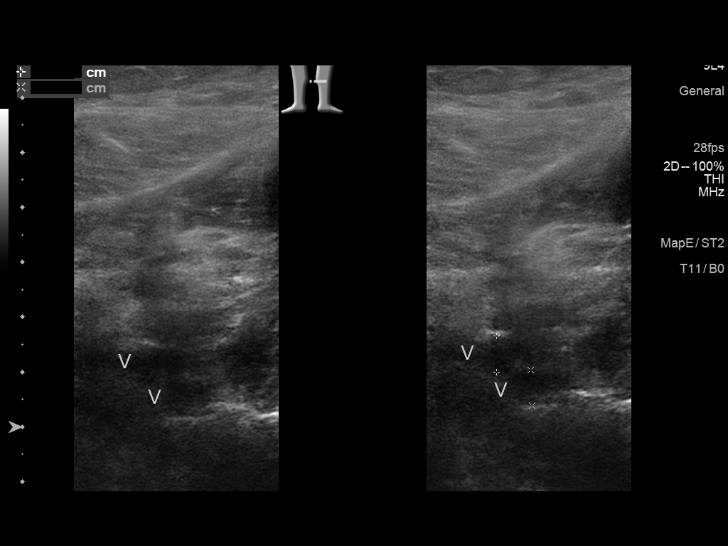
[im 37/37]
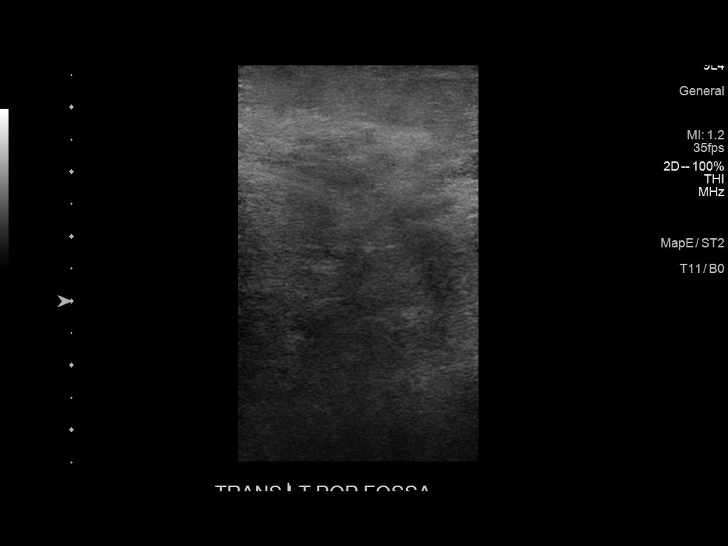

[13 of 24 positions shown; findings below may reference images not displayed]

FINDINGS: Contralateral Common Femoral Vein: Respiratory phasicity is normal
and symmetric with the symptomatic side. No evidence of thrombus.
Normal compressibility.

Common Femoral Vein: No evidence of thrombus. Normal
compressibility, respiratory phasicity and response to augmentation.

Saphenofemoral Junction: No evidence of thrombus. Normal
compressibility and flow on color Doppler imaging.

Profunda Femoral Vein: No evidence of thrombus. Normal
compressibility and flow on color Doppler imaging.

Femoral Vein: No evidence of thrombus. Normal compressibility,
respiratory phasicity and response to augmentation.

Popliteal Vein: No evidence of thrombus. Normal compressibility,
respiratory phasicity and response to augmentation.

Calf Veins: Occlusive thrombus again identified in the left peroneal
vein. No propagation into the popliteal vein or other proximal
veins.

Superficial Great Saphenous Vein: No evidence of thrombus. Normal
compressibility.

Venous Reflux:  None.

Other Findings: No evidence of superficial thrombophlebitis or
abnormal fluid collection.
IMPRESSION: Persistent evidence of left peroneal vein DVT. No additional
proximal DVT identified.

## 2021-10-26 ENCOUNTER — Other Ambulatory Visit: Payer: Self-pay

## 2021-10-26 DIAGNOSIS — L03311 Cellulitis of abdominal wall: Secondary | ICD-10-CM | POA: Insufficient documentation

## 2021-10-26 DIAGNOSIS — K429 Umbilical hernia without obstruction or gangrene: Secondary | ICD-10-CM | POA: Insufficient documentation

## 2021-10-26 DIAGNOSIS — R1033 Periumbilical pain: Secondary | ICD-10-CM | POA: Diagnosis present

## 2021-10-26 NOTE — ED Triage Notes (Signed)
Pt presents to ER with c/o bleeding from umbilical herna x30 minutes.  Pt also states he is having some pain with movement in the area of his hernia.  Pt states he was seen at PCP and was told to come be seen if he had any bleeding or pain from area.  Pt had labs drawn earlier today at his PCP for this issue.  Pt has some reddish colored drainage noted for umbilicus.  Pt otherwise A&O x4 at this time in NAD in triage.

## 2021-10-27 ENCOUNTER — Emergency Department
Admission: EM | Admit: 2021-10-27 | Discharge: 2021-10-27 | Disposition: A | Discharge status: Home / Self Care | Payer: BC Managed Care – PPO | Attending: Emergency Medicine | Admitting: Emergency Medicine

## 2021-10-27 ENCOUNTER — Emergency Department: Payer: BC Managed Care – PPO

## 2021-10-27 DIAGNOSIS — L03311 Cellulitis of abdominal wall: Secondary | ICD-10-CM

## 2021-10-27 DIAGNOSIS — K429 Umbilical hernia without obstruction or gangrene: Secondary | ICD-10-CM

## 2021-10-27 LAB — URINALYSIS, ROUTINE W REFLEX MICROSCOPIC
Bacteria, UA: NONE SEEN
Bilirubin Urine: NEGATIVE
Glucose, UA: 50 mg/dL — AB
Ketones, ur: NEGATIVE mg/dL
Leukocytes,Ua: NEGATIVE
Nitrite: NEGATIVE
Protein, ur: NEGATIVE mg/dL
Specific Gravity, Urine: 1.02 (ref 1.005–1.030)
pH: 5 (ref 5.0–8.0)

## 2021-10-27 LAB — CBC WITH DIFFERENTIAL/PLATELET
Abs Immature Granulocytes: 0.04 10*3/uL (ref 0.00–0.07)
Basophils Absolute: 0 10*3/uL (ref 0.0–0.1)
Basophils Relative: 0 %
Eosinophils Absolute: 0.4 10*3/uL (ref 0.0–0.5)
Eosinophils Relative: 3 %
HCT: 39.1 % (ref 39.0–52.0)
Hemoglobin: 13.1 g/dL (ref 13.0–17.0)
Immature Granulocytes: 0 %
Lymphocytes Relative: 26 %
Lymphs Abs: 3.1 10*3/uL (ref 0.7–4.0)
MCH: 31.3 pg (ref 26.0–34.0)
MCHC: 33.5 g/dL (ref 30.0–36.0)
MCV: 93.5 fL (ref 80.0–100.0)
Monocytes Absolute: 0.6 10*3/uL (ref 0.1–1.0)
Monocytes Relative: 5 %
Neutro Abs: 7.6 10*3/uL (ref 1.7–7.7)
Neutrophils Relative %: 66 %
Platelets: 275 10*3/uL (ref 150–400)
RBC: 4.18 MIL/uL — ABNORMAL LOW (ref 4.22–5.81)
RDW: 11.9 % (ref 11.5–15.5)
WBC: 11.8 10*3/uL — ABNORMAL HIGH (ref 4.0–10.5)
nRBC: 0 % (ref 0.0–0.2)

## 2021-10-27 LAB — COMPREHENSIVE METABOLIC PANEL
ALT: 26 U/L (ref 0–44)
AST: 34 U/L (ref 15–41)
Albumin: 4.2 g/dL (ref 3.5–5.0)
Alkaline Phosphatase: 85 U/L (ref 38–126)
Anion gap: 11 (ref 5–15)
BUN: 18 mg/dL (ref 6–20)
CO2: 23 mmol/L (ref 22–32)
Calcium: 8.8 mg/dL — ABNORMAL LOW (ref 8.9–10.3)
Chloride: 105 mmol/L (ref 98–111)
Creatinine, Ser: 1.19 mg/dL (ref 0.61–1.24)
GFR, Estimated: >60 mL/min (ref >60)
Glucose, Bld: 195 mg/dL — ABNORMAL HIGH (ref 70–99)
Potassium: 3.7 mmol/L (ref 3.5–5.1)
Sodium: 139 mmol/L (ref 135–145)
Total Bilirubin: 0.9 mg/dL (ref 0.3–1.2)
Total Protein: 7.8 g/dL (ref 6.5–8.1)

## 2021-10-27 LAB — LIPASE, BLOOD: Lipase: 36 U/L (ref 11–51)

## 2021-10-27 MED ORDER — IOHEXOL 300 MG/ML  SOLN
100.0000 mL | Freq: Once | INTRAMUSCULAR | Status: AC | PRN
  Administered 2021-10-27: 100 mL via INTRAVENOUS

## 2021-10-27 MED ORDER — CEPHALEXIN 500 MG PO CAPS: 500.0000 mg | ORAL_CAPSULE | Freq: Four times a day (QID) | ORAL | 0 refills | Status: DC

## 2021-10-27 MED ORDER — CEPHALEXIN 500 MG PO CAPS
500.0000 mg | ORAL_CAPSULE | Freq: Once | ORAL | Status: AC
  Administered 2021-10-27: 500 mg via ORAL
  Filled 2021-10-27: qty 1

## 2021-10-27 NOTE — ED Provider Notes (Signed)
[facility] Provider Note    Event Date/Time   First MD Initiated Contact with Patient 10/27/21 0133     (approximate)   History   Hernia   HPI  Frank Decker is a 41 y.o. male  who presents to the emergency department today because of concern for bleeding from his belly button.  The patient noticed the bleeding today.  Says that he does have a history of an umbilical hernia.  Has been seen by primary care for this.  He has had some increasing pain to that area over the past few days.  Pain is worse with bending over.  Had an episode of bloody stool couple weeks ago but since then has been having normal bowel movements.  No nausea or vomiting.      Physical Exam   Triage Vital Signs: ED Triage Vitals  Enc Vitals Group     BP 10/26/21 2122 (!) 147/95     Pulse Rate 10/26/21 2122 95     Resp 10/26/21 2122 16     Temp 10/26/21 2122 98.7 F (37.1 C)     Temp Source 10/26/21 2122 Oral     SpO2 10/26/21 2122 95 %     Weight 10/26/21 2123 (!) 324 lb (147 kg)     Height 10/26/21 2123 5\' 10"  (1.778 m)     Head Circumference --      Peak Flow --      Pain Score 10/26/21 2123 2     Pain Loc --      Pain Edu? --      Excl. in GC? --     Most recent vital signs: Vitals:   10/26/21 2122 10/27/21 0035  BP: (!) 147/95 (!) 140/80  Pulse: 95   Resp: 16   Temp: 98.7 F (37.1 C)   SpO2: 95%     General: Awake, alert, oriented. CV:  Good peripheral perfusion. Regular rate and rhythm. Resp:  Normal effort.  Abd:  Small umbilical hernia, some serosanguinous fluid is expressed from the umbilicus with palpation. Periumbilical erythema and warmth.    ED Results / Procedures / Treatments   Labs (all labs ordered are listed, but only abnormal results are displayed) Labs Reviewed  COMPREHENSIVE METABOLIC PANEL - Abnormal; Notable for the following components:      Result Value   Glucose, Bld 195 (*)    Calcium 8.8 (*)    All other components within  normal limits  CBC WITH DIFFERENTIAL/PLATELET - Abnormal; Notable for the following components:   WBC 11.8 (*)    RBC 4.18 (*)    All other components within normal limits  LIPASE, BLOOD  URINALYSIS, ROUTINE W REFLEX MICROSCOPIC     EKG  None   RADIOLOGY I independently interpreted and visualized the CT abd/pel. My interpretation: No free air. Radiology interpretation:  IMPRESSION:  1. 5.8 cm x 4.1 cm right-sided fat-containing paraumbilical hernia.  2. Mild anterior pelvic wall cellulitis.  3. Sigmoid diverticulosis.    PROCEDURES:  Critical Care performed: No  Procedures   MEDICATIONS ORDERED IN ED: Medications  iohexol (OMNIPAQUE) 300 MG/ML solution 100 mL (100 mLs Intravenous Contrast Given 10/27/21 0043)     IMPRESSION / MDM / ASSESSMENT AND PLAN / ED COURSE  I reviewed the triage vital signs and the nursing notes.  Differential diagnosis includes, but is not limited to, hernia, abscess, cellulitis.   Patient's presentation is most consistent with acute presentation with potential threat to life or bodily function.  Patient presented to the emergency department today because of concerns for bleeding from the bellybutton.  Patient does have umbilical hernia on exam and small amount of serosanguineous fluid was expressed upon palpation.  Additionally patient has evidence of cellulitis periumbilically.  CT scan was obtained which showed a fat-containing umbilical hernia.  I discussed this finding with the patient.  I do think the discharge is likely secondary to fat necrosis or potentially secondary to the cellulitis.  However given lack of any intestinal involvement do not feel any emergent surgical intervention is necessary.  We will plan on starting patient on antibiotics.  Will give first dose here in the emergency department.  Will give patient surgery follow-up information.   FINAL CLINICAL IMPRESSION(S) / ED DIAGNOSES   Final  diagnoses:  Umbilical hernia without obstruction and without gangrene  Cellulitis of abdominal wall     Note:  This document was prepared using Dragon voice recognition software and may include unintentional dictation errors.    Phineas Semen, MD 10/27/21 [phone]

## 2021-10-27 NOTE — Discharge Instructions (Signed)
Please seek medical attention for any high fevers, chest pain, shortness of breath, change in behavior, persistent vomiting, bloody stool or any other new or concerning symptoms.  

## 2021-11-06 ENCOUNTER — Encounter: Payer: Self-pay | Admitting: Surgery

## 2021-11-06 ENCOUNTER — Ambulatory Visit (INDEPENDENT_AMBULATORY_CARE_PROVIDER_SITE_OTHER): Payer: BC Managed Care – PPO | Admitting: Surgery

## 2021-11-06 ENCOUNTER — Telehealth: Payer: Self-pay | Admitting: Urgent Care

## 2021-11-06 VITALS — BP 125/77 | HR 88 | Temp 97.9°F | Ht 70.0 in | Wt 336.0 lb

## 2021-11-06 DIAGNOSIS — K429 Umbilical hernia without obstruction or gangrene: Secondary | ICD-10-CM | POA: Diagnosis not present

## 2021-11-06 NOTE — Progress Notes (Signed)
  Perioperative Services Pre-Admission/Anesthesia Testing    Date: 11/06/21  Name: Frank Decker MRN:   [id number]  Re: GLP-1 instructions for upcoming surgery  Planned Surgical Procedure(s):  Procedure: ROBOTIC ASSISTED VENTRAL HERNIA                  11/14/2021 Surgeon: Dr. Sterling Big, MD  Clinical Notes:  Patient is scheduled for the above procedure on 11/14/2021 with Dr. Sterling Big, MD. received communication from patient's surgeon advising of plans for proposed surgery date.  MD make mention of the fact that patient recently started on semaglutide (Ozempic).  MD inquiring as to whether or not her surgery date will allow for appropriate washout time for GLP-1 medication.  Contacted patient to discuss.  Patient takes his semaglutide dose on Fridays; last dose was on 11/03/2021.  Patient was advised to hold upcoming dose of semaglutide scheduled for 11/10/2021.  Discussed rationale for holding medication in the perioperative setting with patient.  Patient verbalized understanding of education is provided.  Instructed patient that any change to the aforementioned plan will be discussed with him during his anesthesia interview with PAT staff, however plans at this time are for him to resume regularly scheduled dose of semaglutide on 11/17/2021.  Return communication to primary attending surgeon to make him aware that patient has been informed of need to hold his medical top dose.  We will keep in touch with surgery throughout the perioperative period, however I do not envision any changes to this plan. Notice sent to PCP regarding holding GLP-1 dose.   Quentin Mulling, MSN, APRN, FNP-C, CEN [facility]  Peri-operative Services Nurse Practitioner Phone: [phone] 11/06/21 4:16 PM  NOTE: This note has been prepared using Dragon dictation software. Despite my best ability to proofread, there is always the potential that unintentional transcriptional errors may still occur  from this process.

## 2021-11-06 NOTE — Progress Notes (Signed)
Patient ID: Frank Decker, male   DOB: 09/18/1980, 41 y.o.   MRN: [id number]  HPI Frank Decker is a 41 y.o. male in consultation at the request of Dr. Goodman.  He came into the emergency room 10 days ago due to severe pain around the umbilicus associated with some bleeding.  Also reports the pain was moderate intensity and had some cellulitis.  Patient denies any fevers or chills.  He did have appropriate work-up to include a CT scan of the abdomen pelvis personally reviewed showing evidence of an umbilical hernia that at that time measured about 5 cm.  Did have normal CBC and CMP.  He has been recently started on Ozempic.  He is prediabetic and his hemoglobin A1c was 5.7.  His last sugars were normal.  He did have at some point time some elevated sugars of 195. He is able to perform more than 4 METS of activity without any shortness of breath or chest pain.  He works in construction  HPI  Past Medical History:  Diagnosis Date   Hypertension    Renal disorder    Sleep apnea     History reviewed. No pertinent surgical history.  Family History  Problem Relation Age of Onset   Diabetes Paternal Grandfather     Social History Social History   Tobacco Use   Smoking status: Former    Types: Cigarettes    Quit date: 03/12/2005    Years since quitting: 16.6   Smokeless tobacco: Never  Substance Use Topics   Alcohol use: Yes    Alcohol/week: 1.0 standard drink of alcohol    Types: 1 Shots of liquor per week    Comment: 5 drinnks monthly   Drug use: No    Allergies  Allergen Reactions   Aspirin Rash   Ibuprofen Rash    Current Outpatient Medications  Medication Sig Dispense Refill   albuterol (VENTOLIN HFA) 108 (90 Base) MCG/ACT inhaler Inhale 2 puffs into the lungs every 6 (six) hours as needed.      allopurinol (ZYLOPRIM) 300 MG tablet Take 600 mg by mouth daily.     atorvastatin (LIPITOR) 20 MG tablet Take by mouth.     metFORMIN (GLUCOPHAGE-XR) 500 MG 24 hr tablet Take by mouth.      olmesartan (BENICAR) 20 MG tablet Take 20 mg by mouth daily.     No current facility-administered medications for this visit.     Review of Systems Full ROS  was asked and was negative except for the information on the HPI  Physical Exam Blood pressure 125/77, pulse 88, temperature 97.9 F (36.6 C), temperature source Oral, height 5' 10" (1.778 m), weight (!) 336 lb (152.4 kg), SpO2 96 %. CONSTITUTIONAL: NAD , alert BMI 48. EYES: Pupils are equal, round, and reactive to light, Sclera are non-icteric. EARS, NOSE, MOUTH AND THROAT: The oropharynx is clear. The oral mucosa is pink and moist. Hearing is intact to voice. LYMPH NODES:  Lymph nodes in the neck are normal. RESPIRATORY:  Lungs are clear. There is normal respiratory effort, with equal breath sounds bilaterally, and without pathologic use of accessory muscles. CARDIOVASCULAR: Heart is regular without murmurs, gallops, or rubs. GI: The abdomen is  soft, nontender, and nondistended.  There is evidence of a 3 cm partially incarcerated umbilical hernia.  There is no evidence of cellulitis there is no evidence of abscess.  It is tender to palpation GU: Rectal deferred.   MUSCULOSKELETAL: Normal muscle strength and tone. No cyanosis or   edema.   SKIN: Turgor is good and there are no pathologic skin lesions or ulcers. NEUROLOGIC: Motor and sensation is grossly normal. Cranial nerves are grossly intact. PSYCH:  Oriented to person, place and time. Affect is normal.  Data Reviewed  I have personally reviewed the patient's imaging, laboratory findings and medical records.    Assessment/Plan  41 year old male with symptomatic umbilical hernia crescendo symptoms.  Discussed with the patient in detail that in an ideal world we need to optimize his weight on the other hand he has significant symptoms is interfering with his quality of life.  Patient was very clear that he does not want to go back to the emergency room and incur a more  distress.  Different options were discussed in detail with the patient.  Plan will be to optimize his weight and then perform elective robotic repair the other 1 will be to go ahead and move forward with the repair knowing that there is increased risk for recurrence and potential infection.  The patient is adamant that he wishes to move forward with the repair even though this raises his perioperative risk.  Do think that we can do it safely with a robotic approach.  Procedure discussed with the patient in detail.  Risks, benefits and possible complications including but not limited to: Bleeding, infection, bowel injuries, recurrence, chronic pain.  He understands and wished to proceed.  Please note that I spent 55 minutes in this encounter including personally reviewing imaging studies, coordinating his care, placing orders, counseling the patient and provided appropriate patient. We will touch base with anesthesia team regarding holding hypoglycemic agents perioperatively   Sterling Big, MD FACS General Surgeon 11/06/2021, 3:39 PM

## 2021-11-06 NOTE — H&P (View-Only) (Signed)
Patient ID: Frank Decker, male   DOB: 1980/05/03, 41 y.o.   MRN: [id number]  HPI Frank Decker is a 41 y.o. male in consultation at the request of Dr. Derrill Kay.  He came into the emergency room 10 days ago due to severe pain around the umbilicus associated with some bleeding.  Also reports the pain was moderate intensity and had some cellulitis.  Patient denies any fevers or chills.  He did have appropriate work-up to include a CT scan of the abdomen pelvis personally reviewed showing evidence of an umbilical hernia that at that time measured about 5 cm.  Did have normal CBC and CMP.  He has been recently started on Ozempic.  He is prediabetic and his hemoglobin A1c was 5.7.  His last sugars were normal.  He did have at some point time some elevated sugars of 195. He is able to perform more than 4 METS of activity without any shortness of breath or chest pain.  He works in [employer]  HPI  Past Medical History:  Diagnosis Date   Hypertension    Renal disorder    Sleep apnea     History reviewed. No pertinent surgical history.  Family History  Problem Relation Age of Onset   Diabetes Paternal Grandfather     Social History Social History   Tobacco Use   Smoking status: Former    Types: Cigarettes    Quit date: 03/12/2005    Years since quitting: 16.6   Smokeless tobacco: Never  Substance Use Topics   Alcohol use: Yes    Alcohol/week: 1.0 standard drink of alcohol    Types: 1 Shots of liquor per week    Comment: 5 drinnks monthly   Drug use: No    Allergies  Allergen Reactions   Aspirin Rash   Ibuprofen Rash    Current Outpatient Medications  Medication Sig Dispense Refill   albuterol (VENTOLIN HFA) 108 (90 Base) MCG/ACT inhaler Inhale 2 puffs into the lungs every 6 (six) hours as needed.      allopurinol (ZYLOPRIM) 300 MG tablet Take 600 mg by mouth daily.     atorvastatin (LIPITOR) 20 MG tablet Take by mouth.     metFORMIN (GLUCOPHAGE-XR) 500 MG 24 hr tablet Take by mouth.      olmesartan (BENICAR) 20 MG tablet Take 20 mg by mouth daily.     No current facility-administered medications for this visit.     Review of Systems Full ROS  was asked and was negative except for the information on the HPI  Physical Exam Blood pressure 125/77, pulse 88, temperature 97.9 F (36.6 C), temperature source Oral, height 5\' 10"  (1.778 m), weight (!) 336 lb (152.4 kg), SpO2 96 %. CONSTITUTIONAL: NAD , alert BMI 48. EYES: Pupils are equal, round, and reactive to light, Sclera are non-icteric. EARS, NOSE, MOUTH AND THROAT: The oropharynx is clear. The oral mucosa is pink and moist. Hearing is intact to voice. LYMPH NODES:  Lymph nodes in the neck are normal. RESPIRATORY:  Lungs are clear. There is normal respiratory effort, with equal breath sounds bilaterally, and without pathologic use of accessory muscles. CARDIOVASCULAR: Heart is regular without murmurs, gallops, or rubs. GI: The abdomen is  soft, nontender, and nondistended.  There is evidence of a 3 cm partially incarcerated umbilical hernia.  There is no evidence of cellulitis there is no evidence of abscess.  It is tender to palpation GU: Rectal deferred.   MUSCULOSKELETAL: Normal muscle strength and tone. No cyanosis or  edema.   SKIN: Turgor is good and there are no pathologic skin lesions or ulcers. NEUROLOGIC: Motor and sensation is grossly normal. Cranial nerves are grossly intact. PSYCH:  Oriented to person, place and time. Affect is normal.  Data Reviewed  I have personally reviewed the patient's imaging, laboratory findings and medical records.    Assessment/Plan  41 year old male with symptomatic umbilical hernia crescendo symptoms.  Discussed with the patient in detail that in an ideal world we need to optimize his weight on the other hand he has significant symptoms is interfering with his quality of life.  Patient was very clear that he does not want to go back to the emergency room and incur a more  distress.  Different options were discussed in detail with the patient.  Plan will be to optimize his weight and then perform elective robotic repair the other 1 will be to go ahead and move forward with the repair knowing that there is increased risk for recurrence and potential infection.  The patient is adamant that he wishes to move forward with the repair even though this raises his perioperative risk.  Do think that we can do it safely with a robotic approach.  Procedure discussed with the patient in detail.  Risks, benefits and possible complications including but not limited to: Bleeding, infection, bowel injuries, recurrence, chronic pain.  He understands and wished to proceed.  Please note that I spent 55 minutes in this encounter including personally reviewing imaging studies, coordinating his care, placing orders, counseling the patient and provided appropriate patient. We will touch base with anesthesia team regarding holding hypoglycemic agents perioperatively   Sterling Big, MD FACS General Surgeon 11/06/2021, 3:39 PM

## 2021-11-06 NOTE — Patient Instructions (Signed)
Our surgery scheduler will call you within 24-48 hours to schedule your surgery. Please have the Blue surgery sheet available when speaking with her.   Umbilical Hernia, Adult  A hernia is a bulge of tissue that pushes through an opening between muscles. An umbilical hernia happens in the abdomen, near the belly button (umbilicus). The hernia may contain tissues from the small intestine, large intestine, or fatty tissue covering the intestines. Umbilical hernias in adults tend to get worse over time, and they require surgical treatment. There are different types of umbilical hernias, including: Indirect hernia. This type is located just above or below the umbilicus. It is the most common type of umbilical hernia in adults. Direct hernia. This type forms through an opening formed by the umbilicus. Reducible hernia. This type of hernia comes and goes. It may be visible only when you strain, lift something heavy, or cough. This type of hernia can be pushed back into the abdomen (reduced). Incarcerated hernia. This type traps abdominal tissue inside the hernia. This type of hernia cannot be reduced. Strangulated hernia. This type of hernia cuts off blood flow to the tissues inside the hernia. The tissues can start to die if this happens. This type of hernia requires emergency treatment. What are the causes? An umbilical hernia happens when tissue inside the abdomen presses on a weak area of the abdominal muscles. What increases the risk? You may have a greater risk of this condition if you: Are obese. Have had several pregnancies. Have a buildup of fluid inside your abdomen. Have had surgery that weakens the abdominal muscles. What are the signs or symptoms? The main symptom of this condition is a painless bulge at or near the belly button. A reducible hernia may be visible only when you strain, lift something heavy, or cough. Other symptoms may include: Dull pain. A feeling of pressure. Symptoms  of a strangulated hernia may include: Pain that gets increasingly worse. Nausea and vomiting. Pain when pressing on the hernia. Skin over the hernia becoming red or purple. Constipation. Blood in the stool. How is this diagnosed? This condition may be diagnosed based on: A physical exam. You may be asked to cough or strain while standing. These actions increase the pressure inside your abdomen and can force the hernia through the opening in your muscles. Your health care provider may try to reduce the hernia by pressing on it. Your symptoms and medical history. How is this treated? Surgery is the only treatment for an umbilical hernia. Surgery for a strangulated hernia is done as soon as possible. If you have a small hernia that is not incarcerated, you may need to lose weight before having surgery. Follow these instructions at home: Lose weight, if told by your health care provider. Do not try to push the hernia back in. Watch your hernia for any changes in color or size. Tell your health care provider if any changes occur. You may need to avoid activities that increase pressure on your hernia. Do not lift anything that is heavier than 10 lb (4.5 kg), or the limit that you are told, until your health care provider says that it is safe. Take over-the-counter and prescription medicines only as told by your health care provider. Keep all follow-up visits. This is important. Contact a health care provider if: Your hernia gets larger. Your hernia becomes painful. Get help right away if: You develop sudden, severe pain near the area of your hernia. You have pain as well as nausea   or vomiting. You have pain and the skin over your hernia changes color. You develop a fever or chills. Summary A hernia is a bulge of tissue that pushes through an opening between muscles. An umbilical hernia happens near the belly button. Surgery is the only treatment for an umbilical hernia. Do not try to push  your hernia back in. Keep all follow-up visits. This is important. This information is not intended to replace advice given to you by your health care provider. Make sure you discuss any questions you have with your health care provider. Document Revised: 10/05/2019 Document Reviewed: 10/05/2019 Elsevier Patient Education  2023 Elsevier Inc.  

## 2021-11-07 ENCOUNTER — Telehealth: Payer: Self-pay | Admitting: Surgery

## 2021-11-07 NOTE — Telephone Encounter (Signed)
Patient has been advised of Pre-Admission date/time, and Surgery date.  Surgery Date: 11/14/21 Preadmission Testing Date: 11/09/21 (phone 8a-1p)  Patient has been made aware to call [phone], between 1-3:00pm the day before surgery, to find out what time to arrive for surgery.

## 2021-11-08 NOTE — Progress Notes (Signed)
  Perioperative Services Pre-Admission/Anesthesia Testing    Date: 11/08/21  Name: Frank Decker MRN:   [id number]  Re: GLP-1 clearance and provider recommendations   Planned Surgical Procedure(s):    Case: [id number] Date/Time: 11/14/21 1145   Procedure: XI ROBOTIC ASSISTED VENTRAL HERNIA   Anesthesia type: General   Pre-op diagnosis: ventral hernia 3 cm   Location: [facility] OR ROOM 07 / [facility]   Surgeons: Leafy Ro, MD   Clinical Notes:  Patient is scheduled for the above procedure on 11/14/2021 with Dr. Sterling Big, MD. In review of his medication reconciliation it was noted that patient is on a prescribed GLP-1 medication. Per guidelines issued by the American Society of Anesthesiologists (ASA), it is recommended that these medications be held for 7 days prior to the patient undergoing any type of elective surgical procedure. The patient is taking the following GLP-1 medication:  [x]  SEMAGLUTIDE (Ozempic, Rybelsus)  []  EXENATIDE (Bydureon, Byetta) []  LIRAGLUTIDE (Victoza, Saxenda)  []  LIXISENATIDE (Adalyxin) []  DULAGLUTIDE (Trulicity)    []  OTHER GLP-1 medication: _______________  out to prescribing provider , MD) to make them aware of the guidelines from anesthesia. Given that this patient takes the prescribed GLP-1 medication for his  diabetes diagnosis, rather than for weight loss, recommendations from the prescribing provider were solicited. Prescribing provider made aware of the following so that informed decision/POC can be developed for this patient that may be taking medications belonging to these drug classes:  Oral GLP-1 medications will be held 1 day prior to surgery.  Injectable GLP-1 medications will be held 7 days prior to surgery.  Metformin is routinely held 48 hours prior to surgery due to renal concerns, potential need for contrasted imaging perioperatively, and the potential for tissue hypoxia leading to drug induced lactic  acidosis.  All SGLT2i medications are held 72 hours prior to surgery as they can be associated with the increased potential for developing euglycemic diabetic ketoacidosis (EDKA).   Impression and Plan:  Asbury Hair is on a prescribed GLP-1 medication, which induces the known side effect of decreased gastric emptying. Efforts are bring made to mitigate the risk of perioperative hyperglycemic events, as elevated blood glucose levels have been found to contribute to intra/postoperative complications. Additionally, hyperglycemic extremes can potentially necessitate the postponing of a patient's elective case in order to better optimize perioperative glycemic control, again with the aforementioned guidelines in place. With this in mind, recommendations have been sought from the prescribing provider, who has cleared patient to proceed with holding the prescribed GLP-1 as per the guidelines from the ASA.   Provider recommending: no further recommendations received from the prescribing provider.  Copy of signed clearance and recommendations placed on patient's chart for inclusion in their medical record and for review by the surgical/anesthetic team on the day of his procedure.   , MSN, APRN, FNP-C, CEN [facility]  Peri-operative Services Nurse Practitioner Phone: [phone] 11/08/21 1:48 PM  NOTE: This note has been prepared using Dragon dictation software. Despite my best ability to proofread, there is always the potential that unintentional transcriptional errors may still occur from this process.

## 2021-11-09 ENCOUNTER — Encounter
Admission: RE | Admit: 2021-11-09 | Discharge: 2021-11-09 | Disposition: A | Discharge status: Home / Self Care | Payer: BC Managed Care – PPO | Source: Ambulatory Visit | Attending: Surgery | Admitting: Surgery

## 2021-11-09 ENCOUNTER — Other Ambulatory Visit: Payer: Self-pay

## 2021-11-09 DIAGNOSIS — I10 Essential (primary) hypertension: Secondary | ICD-10-CM

## 2021-11-09 DIAGNOSIS — Z01812 Encounter for preprocedural laboratory examination: Secondary | ICD-10-CM

## 2021-11-09 DIAGNOSIS — R7303 Prediabetes: Secondary | ICD-10-CM

## 2021-11-09 HISTORY — DX: Personal history of urinary calculi: Z87.442

## 2021-11-09 HISTORY — DX: Type 2 diabetes mellitus without complications: E11.9

## 2021-11-09 NOTE — Patient Instructions (Addendum)
Your procedure is scheduled on: 11/14/21 - Tuesday Report to the Registration Desk on the 1st floor of the [facility]. To find out your arrival time, please call [phone] between 1PM - 3PM on: 11/10/21 - FRIDAY If your arrival time is 6:00 am, do not arrive prior to that time as the [facility] entrance doors do not open until 6:00 am.  REMEMBER: Instructions that are not followed completely may result in serious medical risk, up to and including death; or upon the discretion of your surgeon and anesthesiologist your surgery may need to be rescheduled.  Do not eat food or drink any fluids after midnight the night before surgery.  No gum chewing, lozengers or hard candies.   TAKE THESE MEDICATIONS THE MORNING OF SURGERY WITH A SIP OF WATER:  - allopurinol (ZYLOPRIM)  - atorvastatin (LIPITOR)    HOLD OZEMPIC 7 days prior to your surgery.  One week prior to surgery: Stop Anti-inflammatories (NSAIDS) such as Advil, Aleve, Ibuprofen, Motrin, Naproxen, Naprosyn and Aspirin based products such as Excedrin, Goodys Powder, BC Powder.  Stop ANY OVER THE COUNTER supplements until after surgery.  You may take Tylenol if needed for pain up until the day of surgery.  No Alcohol for 24 hours before or after surgery.  No Smoking including e-cigarettes for 24 hours prior to surgery.  No chewable tobacco products for at least 6 hours prior to surgery.  No nicotine patches on the day of surgery.  Do not use any "recreational" drugs for at least a week prior to your surgery.  Please be advised that the combination of cocaine and anesthesia may have negative outcomes, up to and including death. If you test positive for cocaine, your surgery will be cancelled.  On the morning of surgery brush your teeth with toothpaste and water, you may rinse your mouth with mouthwash if you wish. Do not swallow any toothpaste or mouthwash.  Use CHG Soap or wipes as directed on instruction sheet.  Do  not wear jewelry, make-up, hairpins, clips or nail polish.  Do not wear lotions, powders, or perfumes.   Do not shave body from the neck down 48 hours prior to surgery just in case you cut yourself which could leave a site for infection.  Also, freshly shaved skin may become irritated if using the CHG soap.  Contact lenses, hearing aids and dentures may not be worn into surgery.  Do not bring valuables to the hospital. [facility] is not responsible for any missing/lost belongings or valuables.   Bring your C-PAP to the hospital with you in case you may have to spend the night.   Notify your doctor if there is any change in your medical condition (cold, fever, infection).  Wear comfortable clothing (specific to your surgery type) to the hospital.  After surgery, you can help prevent lung complications by doing breathing exercises.  Take deep breaths and cough every 1-2 hours. Your doctor may order a device called an Incentive Spirometer to help you take deep breaths. When coughing or sneezing, hold a pillow firmly against your incision with both hands. This is called "splinting." Doing this helps protect your incision. It also decreases belly discomfort.  If you are being admitted to the hospital overnight, leave your suitcase in the car. After surgery it may be brought to your room.  If you are being discharged the day of surgery, you will not be allowed to drive home. You will need a responsible adult (18 years or  older) to drive you home and stay with you that night.   If you are taking public transportation, you will need to have a responsible adult (18 years or older) with you. Please confirm with your physician that it is acceptable to use public transportation.   Please call the Pre-admissions Testing Dept. at [phone] if you have any questions about these instructions.  Surgery Visitation Policy:  Patients undergoing a surgery or procedure may have two family members  or support persons with them as long as the person is not COVID-19 positive or experiencing its symptoms.   Inpatient Visitation:    Visiting hours are 7 a.m. to 8 p.m. Up to four visitors are allowed at one time in a patient room, including children. The visitors may rotate out with other people during the day. One designated support person (adult) may remain overnight.

## 2021-11-10 ENCOUNTER — Encounter
Admission: RE | Admit: 2021-11-10 | Discharge: 2021-11-10 | Disposition: A | Discharge status: Home / Self Care | Payer: BC Managed Care – PPO | Source: Ambulatory Visit | Attending: Surgery | Admitting: Surgery

## 2021-11-10 DIAGNOSIS — I10 Essential (primary) hypertension: Secondary | ICD-10-CM | POA: Diagnosis not present

## 2021-11-10 DIAGNOSIS — Z01810 Encounter for preprocedural cardiovascular examination: Secondary | ICD-10-CM | POA: Insufficient documentation

## 2021-11-10 DIAGNOSIS — Z01812 Encounter for preprocedural laboratory examination: Secondary | ICD-10-CM

## 2021-11-14 ENCOUNTER — Encounter
Admission: RE | Disposition: A | Discharge status: Home / Self Care | Payer: Self-pay | Source: Home / Self Care | Attending: Surgery

## 2021-11-14 ENCOUNTER — Other Ambulatory Visit: Payer: Self-pay

## 2021-11-14 ENCOUNTER — Encounter: Payer: Self-pay | Admitting: Surgery

## 2021-11-14 ENCOUNTER — Ambulatory Visit
Admission: RE | Admit: 2021-11-14 | Discharge: 2021-11-14 | Disposition: A | Discharge status: Home / Self Care | Payer: BC Managed Care – PPO | Attending: Surgery | Admitting: Surgery

## 2021-11-14 ENCOUNTER — Ambulatory Visit: Payer: BC Managed Care – PPO | Admitting: Urgent Care

## 2021-11-14 DIAGNOSIS — Z6841 Body Mass Index (BMI) 40.0 and over, adult: Secondary | ICD-10-CM | POA: Diagnosis not present

## 2021-11-14 DIAGNOSIS — I10 Essential (primary) hypertension: Secondary | ICD-10-CM | POA: Diagnosis not present

## 2021-11-14 DIAGNOSIS — K436 Other and unspecified ventral hernia with obstruction, without gangrene: Secondary | ICD-10-CM | POA: Diagnosis not present

## 2021-11-14 DIAGNOSIS — Z87891 Personal history of nicotine dependence: Secondary | ICD-10-CM | POA: Diagnosis not present

## 2021-11-14 DIAGNOSIS — K429 Umbilical hernia without obstruction or gangrene: Secondary | ICD-10-CM

## 2021-11-14 DIAGNOSIS — E669 Obesity, unspecified: Secondary | ICD-10-CM | POA: Diagnosis not present

## 2021-11-14 DIAGNOSIS — E1165 Type 2 diabetes mellitus with hyperglycemia: Secondary | ICD-10-CM | POA: Insufficient documentation

## 2021-11-14 DIAGNOSIS — Z01812 Encounter for preprocedural laboratory examination: Secondary | ICD-10-CM

## 2021-11-14 DIAGNOSIS — R7303 Prediabetes: Secondary | ICD-10-CM

## 2021-11-14 DIAGNOSIS — G4730 Sleep apnea, unspecified: Secondary | ICD-10-CM | POA: Diagnosis not present

## 2021-11-14 HISTORY — PX: INSERTION OF MESH: SHX5868

## 2021-11-14 HISTORY — PX: XI ROBOTIC ASSISTED VENTRAL HERNIA: SHX6789

## 2021-11-14 LAB — GLUCOSE, CAPILLARY
Glucose-Capillary: 102 mg/dL — ABNORMAL HIGH (ref 70–99)
Glucose-Capillary: 170 mg/dL — ABNORMAL HIGH (ref 70–99)

## 2021-11-14 SURGERY — REPAIR, HERNIA, VENTRAL, ROBOT-ASSISTED
Anesthesia: General

## 2021-11-14 MED ORDER — GLYCOPYRROLATE 0.2 MG/ML IJ SOLN
INTRAMUSCULAR | Status: DC | PRN
  Administered 2021-11-14: .2 mg via INTRAVENOUS

## 2021-11-14 MED ORDER — FENTANYL CITRATE (PF) 100 MCG/2ML IJ SOLN
INTRAMUSCULAR | Status: DC | PRN
Start: 2021-11-14 — End: 2021-11-14
  Administered 2021-11-14: 100 ug via INTRAVENOUS
  Administered 2021-11-14 (×2): 50 ug via INTRAVENOUS

## 2021-11-14 MED ORDER — DEXTROSE 5 % IV SOLN
INTRAVENOUS | Status: DC | PRN
  Administered 2021-11-14: 3 g via INTRAVENOUS

## 2021-11-14 MED ORDER — BUPIVACAINE LIPOSOME 1.3 % IJ SUSP
INTRAMUSCULAR | Status: AC
  Filled 2021-11-14: qty 20

## 2021-11-14 MED ORDER — OXYCODONE HCL 5 MG PO TABS
5.0000 mg | ORAL_TABLET | Freq: Once | ORAL | Status: AC | PRN
  Administered 2021-11-14: 5 mg via ORAL

## 2021-11-14 MED ORDER — ACETAMINOPHEN 500 MG PO TABS
ORAL_TABLET | ORAL | Status: AC
  Administered 2021-11-14: 1000 mg via ORAL
  Filled 2021-11-14: qty 2

## 2021-11-14 MED ORDER — FAMOTIDINE 20 MG PO TABS: 20.0000 mg | ORAL_TABLET | Freq: Once | ORAL | Status: AC

## 2021-11-14 MED ORDER — ALBUTEROL SULFATE HFA 108 (90 BASE) MCG/ACT IN AERS
INHALATION_SPRAY | RESPIRATORY_TRACT | Status: AC
Start: 2021-11-14 — End: ?
  Filled 2021-11-14: qty 6.7

## 2021-11-14 MED ORDER — DEXAMETHASONE SODIUM PHOSPHATE 10 MG/ML IJ SOLN
INTRAMUSCULAR | Status: DC | PRN
  Administered 2021-11-14: 10 mg via INTRAVENOUS

## 2021-11-14 MED ORDER — PROPOFOL 10 MG/ML IV BOLUS
INTRAVENOUS | Status: AC
  Filled 2021-11-14: qty 20

## 2021-11-14 MED ORDER — FAMOTIDINE 20 MG PO TABS
ORAL_TABLET | ORAL | Status: AC
  Administered 2021-11-14: 20 mg via ORAL
  Filled 2021-11-14: qty 1

## 2021-11-14 MED ORDER — BUPIVACAINE LIPOSOME 1.3 % IJ SUSP
INTRAMUSCULAR | Status: DC | PRN
  Administered 2021-11-14: 20 mL

## 2021-11-14 MED ORDER — HYDROCODONE-ACETAMINOPHEN 5-325 MG PO TABS: 1.0000 | ORAL_TABLET | ORAL | 0 refills | Status: DC | PRN

## 2021-11-14 MED ORDER — LIDOCAINE HCL (CARDIAC) PF 100 MG/5ML IV SOSY
PREFILLED_SYRINGE | INTRAVENOUS | Status: DC | PRN
  Administered 2021-11-14: 100 mg via INTRAVENOUS

## 2021-11-14 MED ORDER — ONDANSETRON HCL 4 MG/2ML IJ SOLN
INTRAMUSCULAR | Status: DC | PRN
  Administered 2021-11-14: 4 mg via INTRAVENOUS

## 2021-11-14 MED ORDER — ACETAMINOPHEN 500 MG PO TABS: 1000.0000 mg | ORAL_TABLET | ORAL | Status: AC

## 2021-11-14 MED ORDER — CHLORHEXIDINE GLUCONATE CLOTH 2 % EX PADS
6.0000 | MEDICATED_PAD | Freq: Once | CUTANEOUS | Status: AC
  Administered 2021-11-14: 6 via TOPICAL

## 2021-11-14 MED ORDER — LIDOCAINE HCL (PF) 2 % IJ SOLN
INTRAMUSCULAR | Status: AC
Start: 2021-11-14 — End: ?
  Filled 2021-11-14: qty 10

## 2021-11-14 MED ORDER — ROCURONIUM BROMIDE 10 MG/ML (PF) SYRINGE
PREFILLED_SYRINGE | INTRAVENOUS | Status: AC
  Filled 2021-11-14: qty 10

## 2021-11-14 MED ORDER — CHLORHEXIDINE GLUCONATE CLOTH 2 % EX PADS: 6.0000 | MEDICATED_PAD | Freq: Once | CUTANEOUS | Status: DC

## 2021-11-14 MED ORDER — ROCURONIUM BROMIDE 10 MG/ML (PF) SYRINGE
PREFILLED_SYRINGE | INTRAVENOUS | Status: AC
Start: 2021-11-14 — End: ?
  Filled 2021-11-14: qty 20

## 2021-11-14 MED ORDER — CEFAZOLIN IN SODIUM CHLORIDE 3-0.9 GM/100ML-% IV SOLN
3.0000 g | INTRAVENOUS | Status: DC
  Filled 2021-11-14: qty 100

## 2021-11-14 MED ORDER — PROPOFOL 10 MG/ML IV BOLUS
INTRAVENOUS | Status: DC | PRN
  Administered 2021-11-14 (×3): 100 mg via INTRAVENOUS

## 2021-11-14 MED ORDER — MIDAZOLAM HCL 2 MG/2ML IJ SOLN
INTRAMUSCULAR | Status: AC
  Filled 2021-11-14: qty 2

## 2021-11-14 MED ORDER — ORAL CARE MOUTH RINSE: 15.0000 mL | Freq: Once | OROMUCOSAL | Status: AC

## 2021-11-14 MED ORDER — MIDAZOLAM HCL 2 MG/2ML IJ SOLN
INTRAMUSCULAR | Status: DC | PRN
  Administered 2021-11-14: 2 mg via INTRAVENOUS

## 2021-11-14 MED ORDER — GABAPENTIN 300 MG PO CAPS
ORAL_CAPSULE | ORAL | Status: AC
  Administered 2021-11-14: 300 mg via ORAL
  Filled 2021-11-14: qty 1

## 2021-11-14 MED ORDER — ROCURONIUM BROMIDE 10 MG/ML (PF) SYRINGE
PREFILLED_SYRINGE | INTRAVENOUS | Status: AC
  Filled 2021-11-14: qty 20

## 2021-11-14 MED ORDER — FENTANYL CITRATE (PF) 100 MCG/2ML IJ SOLN
25.0000 ug | INTRAMUSCULAR | Status: DC | PRN
  Administered 2021-11-14: 50 ug via INTRAVENOUS
  Administered 2021-11-14: 25 ug via INTRAVENOUS

## 2021-11-14 MED ORDER — BUPIVACAINE-EPINEPHRINE 0.25% -1:200000 IJ SOLN
INTRAMUSCULAR | Status: DC | PRN
  Administered 2021-11-14: 30 mL

## 2021-11-14 MED ORDER — LABETALOL HCL 5 MG/ML IV SOLN
INTRAVENOUS | Status: AC
Start: 2021-11-14 — End: ?
  Filled 2021-11-14: qty 4

## 2021-11-14 MED ORDER — PROMETHAZINE HCL 25 MG/ML IJ SOLN: 6.2500 mg | INTRAMUSCULAR | Status: DC | PRN

## 2021-11-14 MED ORDER — CHLORHEXIDINE GLUCONATE 0.12 % MT SOLN
OROMUCOSAL | Status: AC
  Administered 2021-11-14: 15 mL via OROMUCOSAL
  Filled 2021-11-14: qty 15

## 2021-11-14 MED ORDER — DEXMEDETOMIDINE HCL IN NACL 80 MCG/20ML IV SOLN
INTRAVENOUS | Status: AC
  Filled 2021-11-14: qty 20

## 2021-11-14 MED ORDER — OXYCODONE HCL 5 MG PO TABS
ORAL_TABLET | ORAL | Status: AC
  Filled 2021-11-14: qty 1

## 2021-11-14 MED ORDER — GLYCOPYRROLATE 0.2 MG/ML IJ SOLN
INTRAMUSCULAR | Status: AC
  Filled 2021-11-14: qty 2

## 2021-11-14 MED ORDER — DEXMEDETOMIDINE HCL IN NACL 200 MCG/50ML IV SOLN
INTRAVENOUS | Status: DC | PRN
  Administered 2021-11-14: 8 ug via INTRAVENOUS
  Administered 2021-11-14: 4 ug via INTRAVENOUS
  Administered 2021-11-14 (×3): 8 ug via INTRAVENOUS

## 2021-11-14 MED ORDER — OXYCODONE HCL 5 MG/5ML PO SOLN: 5.0000 mg | Freq: Once | ORAL | Status: AC | PRN

## 2021-11-14 MED ORDER — ESMOLOL HCL 100 MG/10ML IV SOLN
INTRAVENOUS | Status: AC
Start: 2021-11-14 — End: ?
  Filled 2021-11-14: qty 10

## 2021-11-14 MED ORDER — SUGAMMADEX SODIUM 500 MG/5ML IV SOLN
INTRAVENOUS | Status: DC | PRN
  Administered 2021-11-14 (×5): 100 mg via INTRAVENOUS

## 2021-11-14 MED ORDER — FENTANYL CITRATE (PF) 100 MCG/2ML IJ SOLN
INTRAMUSCULAR | Status: AC
  Administered 2021-11-14: 25 ug via INTRAVENOUS
  Filled 2021-11-14: qty 2

## 2021-11-14 MED ORDER — ESMOLOL HCL 100 MG/10ML IV SOLN
INTRAVENOUS | Status: DC | PRN
  Administered 2021-11-14: 20 mg via INTRAVENOUS
  Administered 2021-11-14: 10 mg via INTRAVENOUS

## 2021-11-14 MED ORDER — FENTANYL CITRATE (PF) 100 MCG/2ML IJ SOLN
INTRAMUSCULAR | Status: AC
  Filled 2021-11-14: qty 2

## 2021-11-14 MED ORDER — LABETALOL HCL 5 MG/ML IV SOLN
INTRAVENOUS | Status: DC | PRN
  Administered 2021-11-14: 5 mg via INTRAVENOUS

## 2021-11-14 MED ORDER — GABAPENTIN 300 MG PO CAPS: 300.0000 mg | ORAL_CAPSULE | ORAL | Status: AC

## 2021-11-14 MED ORDER — ONDANSETRON HCL 4 MG/2ML IJ SOLN
INTRAMUSCULAR | Status: AC
  Filled 2021-11-14: qty 6

## 2021-11-14 MED ORDER — SODIUM CHLORIDE 0.9 % IV SOLN: INTRAVENOUS | Status: DC

## 2021-11-14 MED ORDER — BUPIVACAINE-EPINEPHRINE (PF) 0.25% -1:200000 IJ SOLN
INTRAMUSCULAR | Status: AC
  Filled 2021-11-14: qty 30

## 2021-11-14 MED ORDER — CHLORHEXIDINE GLUCONATE 0.12 % MT SOLN: 15.0000 mL | Freq: Once | OROMUCOSAL | Status: AC

## 2021-11-14 MED ORDER — DROPERIDOL 2.5 MG/ML IJ SOLN: 0.6250 mg | Freq: Once | INTRAMUSCULAR | Status: DC | PRN

## 2021-11-14 MED ORDER — PHENYLEPHRINE 80 MCG/ML (10ML) SYRINGE FOR IV PUSH (FOR BLOOD PRESSURE SUPPORT)
PREFILLED_SYRINGE | INTRAVENOUS | Status: AC
Start: 2021-11-14 — End: ?
  Filled 2021-11-14: qty 20

## 2021-11-14 MED ORDER — ROCURONIUM BROMIDE 100 MG/10ML IV SOLN
INTRAVENOUS | Status: DC | PRN
  Administered 2021-11-14: 30 mg via INTRAVENOUS
  Administered 2021-11-14: 10 mg via INTRAVENOUS
  Administered 2021-11-14: 20 mg via INTRAVENOUS
  Administered 2021-11-14: 10 mg via INTRAVENOUS
  Administered 2021-11-14: 70 mg via INTRAVENOUS

## 2021-11-14 MED ORDER — ACETAMINOPHEN 10 MG/ML IV SOLN: 1000.0000 mg | Freq: Once | INTRAVENOUS | Status: DC | PRN

## 2021-11-14 MED ORDER — DEXAMETHASONE SODIUM PHOSPHATE 10 MG/ML IJ SOLN
INTRAMUSCULAR | Status: AC
Start: 2021-11-14 — End: ?
  Filled 2021-11-14: qty 3

## 2021-11-14 SURGICAL SUPPLY — 52 items
ADH SKN CLS APL DERMABOND .7 (GAUZE/BANDAGES/DRESSINGS) ×2
ANCHOR TIS RET SYS 235ML (MISCELLANEOUS) IMPLANT
BAG TISS RTRVL C235 10X14 (MISCELLANEOUS) ×2
COVER TIP SHEARS 8 DVNC (MISCELLANEOUS) ×2 IMPLANT
COVER TIP SHEARS 8MM DA VINCI (MISCELLANEOUS) ×2
COVER WAND RF STERILE (DRAPES) ×2 IMPLANT
DERMABOND ADVANCED (GAUZE/BANDAGES/DRESSINGS) ×2
DERMABOND ADVANCED .7 DNX12 (GAUZE/BANDAGES/DRESSINGS) ×2 IMPLANT
DRAPE 3/4 80X56 (DRAPES) ×2 IMPLANT
DRAPE ARM DVNC X/XI (DISPOSABLE) ×6 IMPLANT
DRAPE COLUMN DVNC XI (DISPOSABLE) ×2 IMPLANT
DRAPE DA VINCI XI ARM (DISPOSABLE) ×6
DRAPE DA VINCI XI COLUMN (DISPOSABLE) ×2
ELECT CAUTERY BLADE 6.4 (BLADE) ×2 IMPLANT
ELECT REM PT RETURN 9FT ADLT (ELECTROSURGICAL) ×2
ELECTRODE REM PT RTRN 9FT ADLT (ELECTROSURGICAL) ×2 IMPLANT
GLOVE BIO SURGEON STRL SZ7 (GLOVE) ×4 IMPLANT
GOWN STRL REUS W/ TWL LRG LVL3 (GOWN DISPOSABLE) ×6 IMPLANT
GOWN STRL REUS W/TWL LRG LVL3 (GOWN DISPOSABLE) ×6
GRASPER SUT TROCAR 14GX15 (MISCELLANEOUS) ×2 IMPLANT
IRRIGATION STRYKERFLOW (MISCELLANEOUS) IMPLANT
IRRIGATOR STRYKERFLOW (MISCELLANEOUS)
IV NS 1000ML (IV SOLUTION)
IV NS 1000ML BAXH (IV SOLUTION) IMPLANT
KIT PINK PAD W/HEAD ARE REST (MISCELLANEOUS) ×2
KIT PINK PAD W/HEAD ARM REST (MISCELLANEOUS) ×2 IMPLANT
LABEL OR SOLS (LABEL) ×2 IMPLANT
MANIFOLD NEPTUNE II (INSTRUMENTS) ×2 IMPLANT
MESH PROGRIP HERNIA FLAT 30X15 (Mesh General) IMPLANT
NDL INSUFFLATION 14GA 120MM (NEEDLE) ×2 IMPLANT
NEEDLE HYPO 22GX1.5 SAFETY (NEEDLE) ×2 IMPLANT
NEEDLE INSUFFLATION 14GA 120MM (NEEDLE) ×2 IMPLANT
OBTURATOR OPTICAL STANDARD 8MM (TROCAR) ×2
OBTURATOR OPTICAL STND 8 DVNC (TROCAR) ×2
OBTURATOR OPTICALSTD 8 DVNC (TROCAR) ×2 IMPLANT
PACK LAP CHOLECYSTECTOMY (MISCELLANEOUS) ×2 IMPLANT
PENCIL SMOKE EVACUATOR (MISCELLANEOUS) ×2 IMPLANT
SEAL CANN UNIV 5-8 DVNC XI (MISCELLANEOUS) ×6 IMPLANT
SEAL XI 5MM-8MM UNIVERSAL (MISCELLANEOUS) ×6
SET TUBE SMOKE EVAC HIGH FLOW (TUBING) ×2 IMPLANT
SOLUTION ELECTROLUBE (MISCELLANEOUS) ×2 IMPLANT
SPONGE T-LAP 18X18 ~~LOC~~+RFID (SPONGE) ×2 IMPLANT
SUT MNCRL 4-0 (SUTURE) ×2
SUT MNCRL 4-0 27XMFL (SUTURE) ×2
SUT STRATAFIX PDS 30 CT-1 (SUTURE) ×2 IMPLANT
SUT VICRYL 0 AB UR-6 (SUTURE) ×4 IMPLANT
SUT VLOC 90 2/L VL 12 GS22 (SUTURE) ×2 IMPLANT
SUTURE MNCRL 4-0 27XMF (SUTURE) ×2 IMPLANT
SYR 20ML LL LF (SYRINGE) ×2 IMPLANT
TAPE TRANSPORE STRL 2 31045 (GAUZE/BANDAGES/DRESSINGS) ×2 IMPLANT
TRAP FLUID SMOKE EVACUATOR (MISCELLANEOUS) ×2 IMPLANT
WATER STERILE IRR 500ML POUR (IV SOLUTION) ×2 IMPLANT

## 2021-11-14 NOTE — Anesthesia Preprocedure Evaluation (Addendum)
Anesthesia Evaluation  Patient identified by MRN, date of birth, ID band Patient awake    Reviewed: Allergy & Precautions, NPO status , Patient's Chart, lab work & pertinent test results  Airway Mallampati: III  TM Distance: >3 FB Neck ROM: full    Dental no notable dental hx.    Pulmonary sleep apnea and Continuous Positive Airway Pressure Ventilation , former smoker,    Pulmonary exam normal        Cardiovascular Exercise Tolerance: Good hypertension, Pt. on medications Normal cardiovascular exam  EKG  Normal sinus rhythm Incomplete right bundle branch block Borderline ECG When compared with ECG of 12-May-2017 15:52, Incomplete right bundle branch block is now Present   Neuro/Psych negative neurological ROS  negative psych ROS   GI/Hepatic negative GI ROS, Neg liver ROS,   Endo/Other  diabetesMorbid obesityPre-diabetic  Renal/GU      Musculoskeletal   Abdominal (+) + obese,   Peds  Hematology negative hematology ROS (+)   Anesthesia Other Findings Ventral Hernia  Past Medical History: No date: Diabetes mellitus without complication (HCC) No date: History of kidney stones No date: Hypertension No date: Renal disorder No date: Sleep apnea     Comment:  uses CPAP at times  Past Surgical History: No date: COLONOSCOPY No date: LITHOTRIPSY     Reproductive/Obstetrics negative OB ROS                        Anesthesia Physical Anesthesia Plan  ASA: 3  Anesthesia Plan: General ETT   Post-op Pain Management: Tylenol PO (pre-op)*, Gabapentin PO (pre-op)* and Regional block*   Induction: Intravenous  PONV Risk Score and Plan: 3 and Ondansetron, Dexamethasone and Midazolam  Airway Management Planned: Oral ETT  Additional Equipment:   Intra-op Plan:   Post-operative Plan: Extubation in OR  Informed Consent: I have reviewed the patients History and Physical, chart, labs and  discussed the procedure including the risks, benefits and alternatives for the proposed anesthesia with the patient or authorized representative who has indicated his/her understanding and acceptance.     Dental Advisory Given  Plan Discussed with: Anesthesiologist, CRNA and Surgeon  Anesthesia Plan Comments:     Anesthesia Quick Evaluation

## 2021-11-14 NOTE — Op Note (Signed)
Robotic transabdominal preperitoneal repair (rTAPP) ventral hernia Repair  using  15x15 cms Polyester Progrip mesh  Pre-operative Diagnosis: ventral umbilical hernia   Post-operative Diagnosis: same   Surgeon: Sterling Big, MD FACS   Anesthesia: Gen. with endotracheal tube    Findings: 4 cms ventral hernia incarcerated    Estimated Blood Loss: 5  cc        Complications: none             Procedure Details  The patient was seen again in the Holding Room. The benefits, complications, treatment options, and expected outcomes were discussed with the patient. The risks of bleeding, infection, recurrence of symptoms, failure to resolve symptoms, bowel injury, mesh placement, mesh infection, any of which could require further surgery were reviewed with the patient. The likelihood of improving the patient's symptoms with return to their baseline status is good.  The patient and/or family concurred with the proposed plan, giving informed consent.  The patient was taken to Operating Room, identified and the procedure verified.  A Time Out was held and the above information confirmed.   Prior to the induction of general anesthesia, antibiotic prophylaxis was administered. VTE prophylaxis was in place. General endotracheal anesthesia was then administered and tolerated well. After the induction, the abdomen was prepped with Chloraprep and draped in the sterile fashion. The patient was positioned in the supine position.   We used a left upper quadrant subcostal incision to introduce veres needle at Palmer's point.  Saline drop test was appropiate and pneumoperitoneum obtained.  No hemodynamic compromise.   3 additional 8 mm ports were placed under direct visualization.  I visualized the hernia  there was omentum chronically incarcerated that I was able to reduced. There was an umbilical ventral hernia measuring approximately 4 cm.  At this time I went ahead and inserted the  mesh and needles as I thought  this was a great case for rTAPP.   The robot was brought to the surgical field and docked in the standard fashion.  We made sure that all instrumentation was kept under direct vision at all times and there was no collision between the arms.  I scrubbed out and went to the console.   Visualized the hernia defect and did consider that this was a good approach for preperitoneal placement.  I created a flap of the peritoneum on the left abdominal wall in the same  oval fashion.  We develop an extraperitoneal plane, the plane is developed between the the posterior rectus sheath and the peritoneum dorsally and bilaterally.  We created a large flap and  a single retroperitoneal compartment.  All the hernia contents were reduced 0 Stratafix suture was used to close the defect.  The progrip mesh was placed laying against the post rectus sheath  bilaterally to have a good overlap of the defect.  It laid very nice against the abdominal wall The peritoneum flap was closed with a running V lock suture in the standard fashion. The hernia sac was removed. A second look laparoscopy revealed no evidence of intra-abdominal injury.    All the needles and foreign objects were removed under direct visualization.  The instruments were removed and the robot was undocked.  I scrubbed back in, The laparoscopic ports were removed under direct visualization and the pneumoperitoneum was deflated.   Incisions were closed with  4-0 Monocryl  And the fascial sutures approximated in the standard fashion Dermabond was used to coat the skin.  Liposomal Marcaine  was  used to inject all the incision sites. Patient tolerated procedure well and there were no immediate complications. Needle and laparotomy counts were correct    Sterling Big, MD, FACS

## 2021-11-14 NOTE — Interval H&P Note (Signed)
History and Physical Interval Note:  11/14/2021 10:31 AM  Frank Decker  has presented today for surgery, with the diagnosis of ventral hernia 3 cm.  The various methods of treatment have been discussed with the patient and family. After consideration of risks, benefits and other options for treatment, the patient has consented to  Procedure(s): XI ROBOTIC ASSISTED VENTRAL HERNIA (N/A) as a surgical intervention.  The patient's history has been reviewed, patient examined, no change in status, stable for surgery.  I have reviewed the patient's chart and labs.  Questions were answered to the patient's satisfaction.     Arieon Corcoran F Ayianna Darnold

## 2021-11-14 NOTE — Discharge Instructions (Addendum)
Laparoscopic Ventral Hernia Repair, Care After The following information offers guidance on how to care for yourself after your procedure. Your health care provider may also give you more specific instructions. If you have problems or questions, contact your health care provider. What can I expect after the procedure? After the procedure, it is common to have pain, discomfort, or soreness. Follow these instructions at home: Medicines Take over-the-counter and prescription medicines only as told by your health care provider. Ask your health care provider if the medicine prescribed to you: Requires you to avoid driving or using machinery. Can cause constipation. You may need to take these actions to prevent or treat constipation: Drink enough fluid to keep your urine pale yellow. Take over-the-counter or prescription medicines. Eat foods that are high in fiber, such as beans, whole grains, and fresh fruits and vegetables. Limit foods that are high in fat and processed sugars, such as fried or sweet foods. Incision care  Follow instructions from your health care provider about how to take care of your incisions. Make sure you: Wash your hands with soap and water for at least 20 seconds before and after you change your bandage (dressing) or before you touch your abdomen. If soap and water are not available, use hand sanitizer. Change your dressing as told by your health care provider. Leave stitches (sutures), skin glue, or adhesive strips in place. These skin closures may need to stay in place for 2 weeks or longer. If adhesive strip edges start to loosen and curl up, you may trim the loose edges. Do not remove adhesive strips completely unless your health care provider tells you to do that. Check your incision areas every day for signs of infection. Check for: More redness, swelling, or pain. Fluid or blood. Warmth. Pus or a bad smell. Bathing  Do not take baths, swim, or use a hot tub until  your health care provider approves. Ask your health care provider if you may take showers. You may only be allowed to take sponge baths. Keep your dressing dry until your health care provider says it can be removed. Activity  Rest as told by your health care provider. Avoid sitting for a long time without moving. Get up to take short walks every 1-2 hours. This is important to improve blood flow and breathing. Ask for help if you feel weak or unsteady. Do not lift anything that is heavier than 10 lb (4.5 kg), or the limit that you are told, until your health care provider says that it is safe. If you were given a sedative during the procedure, it can affect you for several hours. Do not drive or operate machinery until your health care provider says that it is safe. Return to your normal activities as told by your health care provider. Ask your health care provider what activities are safe for you. General instructions  Hold a pillow over your abdomen when you cough or sneeze. This helps with pain. Do not use any products that contain nicotine or tobacco. These products include cigarettes, chewing tobacco, and vaping devices, such as e-cigarettes. These can delay healing after surgery. If you need help quitting, ask your health care provider. You may be asked to continue to do deep breathing exercises at home. This will help to prevent a lung infection. Keep all follow-up visits. This is important. Contact a health care provider if: You have any of these signs of infection: More redness, swelling, or pain around an incision. Fluid or blood  coming from an incision. Warmth coming from an incision. Pus or a bad smell coming from an incision. A fever or chills. You have pain that gets worse or does not get better with medicine. You have nausea or vomiting. You have a cough. You have shortness of breath. You have not had a bowel movement in 3 days. You are not able to urinate. Get help right  away if you have: Severe pain in your abdomen. Persistent nausea and vomiting. Redness, warmth, or pain in your leg. Chest pain. Trouble breathing. These symptoms may represent a serious problem that is an emergency. Do not wait to see if the symptoms will go away. Get medical help right away. Call your local emergency services (911 in the [place] Do not drive yourself to the hospital. Summary After this procedure, it is common to have pain, discomfort, or soreness. Follow instructions from your health care provider about how to take care of your incision. Check your incision area every day for signs of infection. Report any signs of infection to your health care provider. Keep all follow-up visits. This is important. This information is not intended to replace advice given to you by your health care provider. Make sure you discuss any questions you have with your health care provider. Document Revised: 10/16/2019 Document Reviewed: 10/16/2019 Elsevier Patient Education  2023 Elsevier Inc.    AMBULATORY SURGERY  DISCHARGE INSTRUCTIONS   The drugs that you were given will stay in your system until tomorrow so for the next 24 hours you should not:  Drive an automobile Make any legal decisions Drink any alcoholic beverage   You may resume regular meals tomorrow.  Today it is better to start with liquids and gradually work up to solid foods.  You may eat anything you prefer, but it is better to start with liquids, then soup and crackers, and gradually work up to solid foods.   Please notify your doctor immediately if you have any unusual bleeding, trouble breathing, redness and pain at the surgery site, drainage, fever, or pain not relieved by medication.    Additional Instructions:        Please contact your physician with any problems or Same Day Surgery at [phone], Monday through Friday 6 am to 4 pm, or [place] at [facility] number at [phone]. AMBULATORY  SURGERY  DISCHARGE INSTRUCTIONS   The drugs that you were given will stay in your system until tomorrow so for the next 24 hours you should not:  Drive an automobile Make any legal decisions Drink any alcoholic beverage   You may resume regular meals tomorrow.  Today it is better to start with liquids and gradually work up to solid foods.  You may eat anything you prefer, but it is better to start with liquids, then soup and crackers, and gradually work up to solid foods.   Please notify your doctor immediately if you have any unusual bleeding, trouble breathing, redness and pain at the surgery site, drainage, fever, or pain not relieved by medication.    Additional Instructions:        Please contact your physician with any problems or Same Day Surgery at [phone], Monday through Friday 6 am to 4 pm, or [place] at [facility] number at [phone].

## 2021-11-14 NOTE — Transfer of Care (Signed)
Immediate Anesthesia Transfer of Care Note  Patient: Frank Decker  Procedure(s) Performed: XI ROBOTIC ASSISTED VENTRAL HERNIA INSERTION OF MESH  Patient Location: PACU  Anesthesia Type:General  Level of Consciousness: drowsy and patient cooperative  Airway & Oxygen Therapy: Patient Spontanous Breathing and Patient connected to face mask oxygen  Post-op Assessment: Report given to RN and Post -op Vital signs reviewed and stable  Post vital signs: Reviewed and stable  Last Vitals:  Vitals Value Taken Time  BP 149/106 11/14/21 1400  Temp    Pulse 81 11/14/21 1402  Resp 24 11/14/21 1402  SpO2 97 % 11/14/21 1402  Vitals shown include unvalidated device data.  Last Pain:  Vitals:   11/14/21 1042  TempSrc: Temporal  PainSc: 0-No pain      Patients Stated Pain Goal: 0 (11/14/21 1042)  Complications: No notable events documented.

## 2021-11-14 NOTE — Anesthesia Procedure Notes (Signed)
Procedure Name: Intubation Date/Time: 11/14/2021 11:21 AM  Performed by: Gayland Curry, CRNAPre-anesthesia Checklist: Patient identified, Emergency Drugs available, Suction available and Patient being monitored Patient Re-evaluated:Patient Re-evaluated prior to induction Oxygen Delivery Method: Circle system utilized Preoxygenation: Pre-oxygenation with 100% oxygen Induction Type: IV induction Ventilation: Mask ventilation without difficulty and Oral airway inserted - appropriate to patient size Laryngoscope Size: Mac and 4 Grade View: Grade I Tube size: 7.0 mm Number of attempts: 1 Placement Confirmation: ETT inserted through vocal cords under direct vision, positive ETCO2 and breath sounds checked- equal and bilateral Secured at: 24 cm Tube secured with: Tape Dental Injury: Teeth and Oropharynx as per pre-operative assessment

## 2021-11-15 ENCOUNTER — Encounter: Payer: Self-pay | Admitting: Surgery

## 2021-11-15 LAB — SURGICAL PATHOLOGY

## 2021-11-15 NOTE — Anesthesia Postprocedure Evaluation (Signed)
Anesthesia Post Note  Patient: Imri Lor  Procedure(s) Performed: XI ROBOTIC ASSISTED VENTRAL HERNIA INSERTION OF MESH  Patient location during evaluation: PACU Anesthesia Type: General Level of consciousness: awake and alert Pain management: pain level controlled Vital Signs Assessment: post-procedure vital signs reviewed and stable Respiratory status: spontaneous breathing, nonlabored ventilation and respiratory function stable Cardiovascular status: blood pressure returned to baseline and stable Postop Assessment: no apparent nausea or vomiting Anesthetic complications: no   No notable events documented.   Last Vitals:  Vitals:   11/14/21 1436 11/14/21 1450  BP:  (!) 130/92  Pulse: 82 77  Resp: 13 17  Temp: (!) 36.1 C (!) 36.3 C  SpO2: (!) 89% 93%    Last Pain:  Vitals:   11/15/21 0945  TempSrc:   PainSc: 7                  Foye Deer

## 2021-11-16 ENCOUNTER — Telehealth: Payer: Self-pay | Admitting: Surgery

## 2021-11-16 NOTE — Telephone Encounter (Signed)
Patient calls, he had ventral hernia done on 11/14/21 with Dr. Everlene Farrier.  Patient states that the Hydrocodone is not helping with his pain.  Has been using Tylenol as well. He is allergic to aspirin and Ibuprofen.  Wants to know if there is something else that can be called in for the pain. He uses Adult nurse in [place].  Please call him to advise. Thank you.

## 2021-11-17 ENCOUNTER — Other Ambulatory Visit: Payer: Self-pay

## 2021-11-17 DIAGNOSIS — K429 Umbilical hernia without obstruction or gangrene: Secondary | ICD-10-CM

## 2021-11-17 MED ORDER — GABAPENTIN 300 MG PO CAPS: ORAL_CAPSULE | ORAL | 0 refills | Status: AC

## 2021-11-29 ENCOUNTER — Ambulatory Visit (INDEPENDENT_AMBULATORY_CARE_PROVIDER_SITE_OTHER): Payer: BC Managed Care – PPO | Admitting: Surgery

## 2021-11-29 ENCOUNTER — Encounter: Payer: Self-pay | Admitting: Surgery

## 2021-11-29 VITALS — BP 144/86 | HR 93 | Temp 98.0°F | Ht 70.0 in | Wt 336.0 lb

## 2021-11-29 DIAGNOSIS — K436 Other and unspecified ventral hernia with obstruction, without gangrene: Secondary | ICD-10-CM

## 2021-11-29 DIAGNOSIS — Z09 Encounter for follow-up examination after completed treatment for conditions other than malignant neoplasm: Secondary | ICD-10-CM

## 2021-11-29 NOTE — Patient Instructions (Signed)

## 2021-12-03 ENCOUNTER — Encounter: Payer: Self-pay | Admitting: Surgery

## 2021-12-03 NOTE — Progress Notes (Signed)
Outpatient Surgical Follow Up  12/03/2021  Frank Decker is an 41 y.o. male.   Chief Complaint  Patient presents with   Routine Post Op    HPI: Frank Decker is 2 weeks out from robotic ventral hernia.  He is doing very well.  Mild soreness.  No fevers no chills no evidence of complications.  Walking and back to normal activities.  Past Medical History:  Diagnosis Date   Diabetes mellitus without complication ([place])    History of kidney stones    Hypertension    Renal disorder    Sleep apnea    uses CPAP at times    Past Surgical History:  Procedure Laterality Date   COLONOSCOPY     INSERTION OF MESH  11/14/2021   Procedure: INSERTION OF MESH;  Surgeon: Jules Husbands, MD;  Location: [facility] ORS;  Service: General;;   LITHOTRIPSY     XI ROBOTIC ASSISTED VENTRAL HERNIA N/A 11/14/2021   Procedure: XI ROBOTIC [place];  Surgeon: Jules Husbands, MD;  Location: [facility] ORS;  Service: General;  Laterality: N/A;    Family History  Problem Relation Age of Onset   Diabetes Paternal Grandfather     Social History:  reports that he quit smoking about 16 years ago. His smoking use included cigarettes. He has never used smokeless tobacco. He reports current alcohol use of about 1.0 standard drink of alcohol per week. He reports that he does not use drugs.  Allergies:  Allergies  Allergen Reactions   Aspirin Rash   Ibuprofen Rash    Medications reviewed.    ROS Full ROS performed and is otherwise negative other than what is stated in HPI   BP (!) 144/86   Pulse 93   Temp 98 F (36.7 C)   Ht 5\' 10"  (1.778 m)   Wt (!) 336 lb (152.4 kg)   SpO2 97%   BMI 48.21 kg/m   Physical Exam Vitals and nursing note reviewed. Exam conducted with a chaperone present.  Constitutional:      Appearance: Normal appearance.  Cardiovascular:     Rate and Rhythm: Normal rate and regular rhythm.     Heart sounds: No murmur heard. Pulmonary:     Effort: Pulmonary effort is normal. No  respiratory distress.     Breath sounds: Normal breath sounds. No stridor.  Abdominal:     General: Abdomen is flat. There is no distension.     Palpations: Abdomen is soft. There is no mass.     Tenderness: There is no abdominal tenderness. There is no guarding.     Hernia: No hernia is present.     Comments: Wounds healing well w/o infection, no evidence of hernia recurrence  Skin:    General: Skin is warm and dry.     Capillary Refill: Capillary refill takes less than 2 seconds.  Neurological:     General: No focal deficit present.     Mental Status: He is alert and oriented to person, place, and time.  Psychiatric:        Mood and Affect: Mood normal.        Behavior: Behavior normal.        Thought Content: Thought content normal.        Judgment: Judgment normal.       Assessment/Plan:  41 year old male status post robotic hernia repair doing very well without complications.  Discussed with patient in detail about postoperative course and lifting restrictions.  No complications at this  time. I spent 20 minutes in this encounter including coordination of his care, counseling the patient and performing appropriate documentation  Sterling Big, MD [facility] General Surgeon

## 2022-01-08 ENCOUNTER — Encounter (INDEPENDENT_AMBULATORY_CARE_PROVIDER_SITE_OTHER): Payer: Self-pay

## 2022-04-25 ENCOUNTER — Ambulatory Visit: Payer: Self-pay

## 2022-04-25 NOTE — Telephone Encounter (Signed)
  Chief Complaint: chest pressure and anxiety Symptoms: mid chest to top of chest pressure - mild but has been coming and going since yesterday Frequency: yest Pertinent Negatives: Patient denies dizziness, nausea, vomiting, sweating, fever, cough Disposition: [x] ED /[] Urgent Care (no appt availability in office) / [] Appointment(In office/virtual)/ []  [facility]/ [] Home Care/ [] Refused Recommended Disposition /[] [facility]/ []  Follow-up with PCP Additional Notes:  Reason for Disposition  [1] Chest pain (or "angina") comes and goes AND [2] is happening more often (increasing in frequency) or getting worse (increasing in severity)  (Exception: Chest pains that last only a few seconds.)  Answer Assessment - Initial Assessment Questions 1. LOCATION: "Where does it hurt?"       Mid chest /top 2. RADIATION: "Does the pain go anywhere else?" (e.g., into neck, jaw, arms, back)     no 3. ONSET: "When did the chest pain begin?" (Minutes, hours or days)      yesterday 4. PATTERN: "Does the pain come and go, or has it been constant since it started?"  "Does it get worse with exertion?"      Comes and goes 5. DURATION: "How long does it last" (e.g., seconds, minutes, hours)     10-15 minutes 6. SEVERITY: "How bad is the pain?"  (e.g., Scale 1-10; mild, moderate, or severe)    - MILD (1-3): doesn't interfere with normal activities     - MODERATE (4-7): interferes with normal activities or awakens from sleep    - SEVERE (8-10): excruciating pain, unable to do any normal activities       Pressure mild 7. CARDIAC RISK FACTORS: "Do you have any history of heart problems or risk factors for heart disease?" (e.g., angina, prior heart attack; diabetes, high blood pressure, high cholesterol, smoker, or strong family history of heart disease)     Prediabetic,HTN,  8. PULMONARY RISK FACTORS: "Do you have any history of lung disease?"  (e.g., blood clots in lung, asthma, emphysema, birth  control pills)     no 9. CAUSE: "What do you think is causing the chest pain?"     anxiety 10. OTHER SYMPTOMS: "Do you have any other symptoms?" (e.g., dizziness, nausea, vomiting, sweating, fever, difficulty breathing, cough)       anxiety 11. PREGNANCY: "Is there any chance you are pregnant?" "When was your last menstrual period?"       N/a  Protocols used: Chest Pain-A-AH

## 2023-10-09 ENCOUNTER — Telehealth: Payer: Self-pay | Admitting: Surgery

## 2023-10-09 NOTE — Telephone Encounter (Signed)
 Pt is to have a MRI done but the people at the imaging place are asking if the mesh that was used when he had his hernia surgery if it had any metal in it. Please advise. Pt # is [phone]
